# Patient Record
Sex: Female | Born: 1960
Health system: Southern US, Community
[De-identification: ages and names within clinical notes are randomized; demographics above are authoritative.]

## PROBLEM LIST (undated history)

## (undated) DIAGNOSIS — C50412 Malignant neoplasm of upper-outer quadrant of left female breast: Secondary | ICD-10-CM

## (undated) DIAGNOSIS — Z923 Personal history of irradiation: Secondary | ICD-10-CM

## (undated) DIAGNOSIS — I499 Cardiac arrhythmia, unspecified: Secondary | ICD-10-CM

## (undated) DIAGNOSIS — N6489 Other specified disorders of breast: Secondary | ICD-10-CM

## (undated) DIAGNOSIS — Z803 Family history of malignant neoplasm of breast: Secondary | ICD-10-CM

## (undated) DIAGNOSIS — I498 Other specified cardiac arrhythmias: Secondary | ICD-10-CM

## (undated) DIAGNOSIS — I472 Ventricular tachycardia, unspecified: Secondary | ICD-10-CM

## (undated) DIAGNOSIS — C50919 Malignant neoplasm of unspecified site of unspecified female breast: Secondary | ICD-10-CM

## (undated) HISTORY — DX: Family history of malignant neoplasm of breast: Z80.3

## (undated) HISTORY — DX: Ventricular tachycardia: I47.2

## (undated) HISTORY — DX: Personal history of irradiation: Z92.3

## (undated) HISTORY — DX: Cardiac arrhythmia, unspecified: I49.9

## (undated) HISTORY — DX: Other specified cardiac arrhythmias: I49.8

## (undated) HISTORY — DX: Malignant neoplasm of upper-outer quadrant of left female breast: C50.412

## (undated) HISTORY — DX: Ventricular tachycardia, unspecified: I47.20

## (undated) HISTORY — PX: COLONOSCOPY: SHX174

---

## 1998-05-15 ENCOUNTER — Other Ambulatory Visit: Admission: RE | Admit: 1998-05-15 | Discharge: 1998-05-15 | Payer: Self-pay | Admitting: Obstetrics and Gynecology

## 2001-01-20 ENCOUNTER — Other Ambulatory Visit: Admission: RE | Admit: 2001-01-20 | Discharge: 2001-01-20 | Payer: Self-pay | Admitting: Obstetrics and Gynecology

## 2002-05-09 ENCOUNTER — Other Ambulatory Visit: Admission: RE | Admit: 2002-05-09 | Discharge: 2002-05-09 | Payer: Self-pay | Admitting: Gynecology

## 2003-08-14 ENCOUNTER — Other Ambulatory Visit: Admission: RE | Admit: 2003-08-14 | Discharge: 2003-08-14 | Payer: Self-pay | Admitting: Gynecology

## 2005-02-10 ENCOUNTER — Ambulatory Visit: Payer: Self-pay | Admitting: Internal Medicine

## 2005-02-20 ENCOUNTER — Ambulatory Visit: Payer: Self-pay | Admitting: Internal Medicine

## 2005-03-07 ENCOUNTER — Ambulatory Visit: Payer: Self-pay | Admitting: Internal Medicine

## 2005-06-10 ENCOUNTER — Other Ambulatory Visit: Admission: RE | Admit: 2005-06-10 | Discharge: 2005-06-10 | Payer: Self-pay | Admitting: Obstetrics and Gynecology

## 2005-10-07 ENCOUNTER — Ambulatory Visit: Payer: Self-pay | Admitting: Internal Medicine

## 2006-04-28 ENCOUNTER — Ambulatory Visit: Payer: Self-pay | Admitting: Internal Medicine

## 2006-05-19 ENCOUNTER — Encounter: Admission: RE | Admit: 2006-05-19 | Discharge: 2006-08-17 | Payer: Self-pay | Admitting: Family Medicine

## 2007-05-14 DIAGNOSIS — I472 Ventricular tachycardia, unspecified: Secondary | ICD-10-CM | POA: Insufficient documentation

## 2007-05-14 DIAGNOSIS — I4729 Other ventricular tachycardia: Secondary | ICD-10-CM | POA: Insufficient documentation

## 2007-05-14 DIAGNOSIS — I059 Rheumatic mitral valve disease, unspecified: Secondary | ICD-10-CM | POA: Insufficient documentation

## 2007-05-14 DIAGNOSIS — E7800 Pure hypercholesterolemia, unspecified: Secondary | ICD-10-CM | POA: Insufficient documentation

## 2007-05-17 DIAGNOSIS — I10 Essential (primary) hypertension: Secondary | ICD-10-CM | POA: Insufficient documentation

## 2007-05-17 DIAGNOSIS — G4700 Insomnia, unspecified: Secondary | ICD-10-CM | POA: Insufficient documentation

## 2012-03-10 ENCOUNTER — Other Ambulatory Visit: Payer: Self-pay | Admitting: Family Medicine

## 2012-03-10 DIAGNOSIS — R7989 Other specified abnormal findings of blood chemistry: Secondary | ICD-10-CM

## 2012-03-11 ENCOUNTER — Ambulatory Visit
Admission: RE | Admit: 2012-03-11 | Discharge: 2012-03-11 | Disposition: A | Discharge status: Home / Self Care | Payer: Managed Care, Other (non HMO) | Source: Ambulatory Visit | Attending: Family Medicine | Admitting: Family Medicine

## 2012-03-11 DIAGNOSIS — R7989 Other specified abnormal findings of blood chemistry: Secondary | ICD-10-CM

## 2013-07-01 IMAGING — US US EXTREM LOW VENOUS*R*
1 series · 14 of 24 positions shown · non-contrast
Comparison: None.

CLINICAL DATA: Elevated D-dimer, leg pain, injury

RIGHT LOWER EXTREMITY VENOUS DUPLEX ULTRASOUND
TECHNIQUE: Gray-scale sonography with graded compression, as well
as color Doppler and duplex ultrasound were performed to evaluate
the deep venous system of the lower extremity from the level of the
common femoral vein through the popliteal and proximal calf veins.
Spectral Doppler was utilized to evaluate flow at rest and with
distal augmentation maneuvers.

[Series 1: us extrem low venous*right* · 14 of 29 slices shown]
[im 1/29]
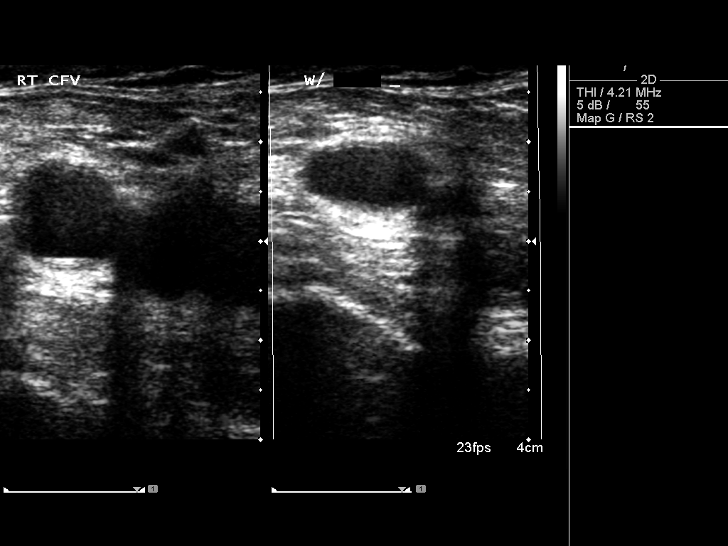
[im 3/29]
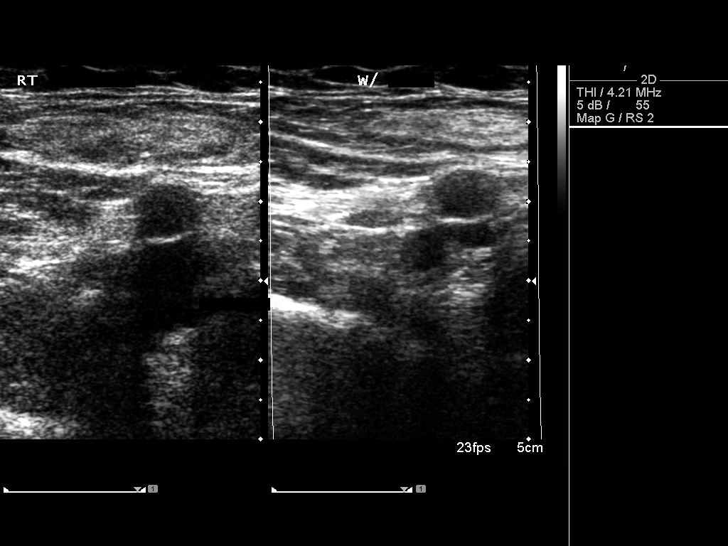
[im 5/29]
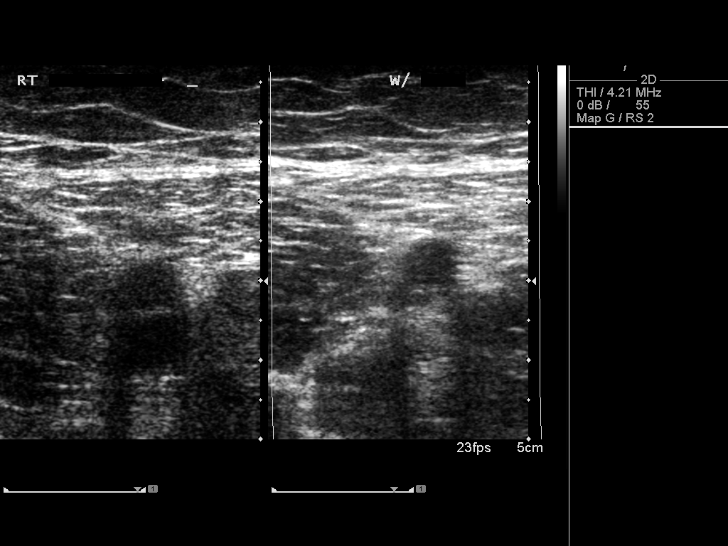
[im 8/29]
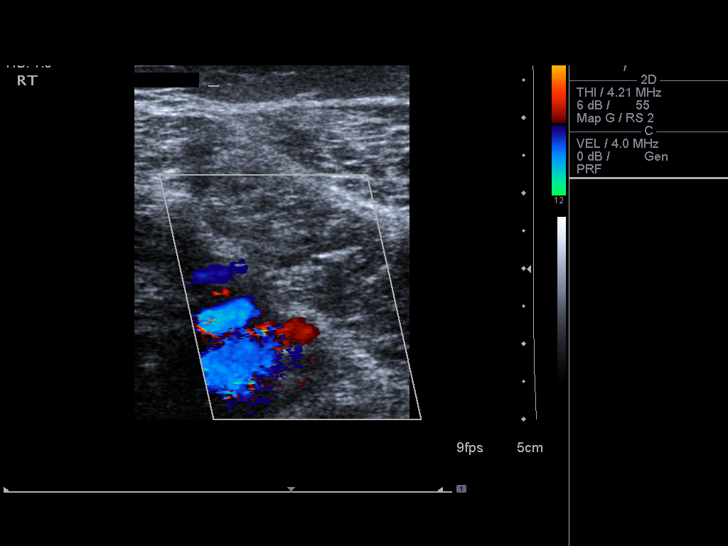
[im 9/29]
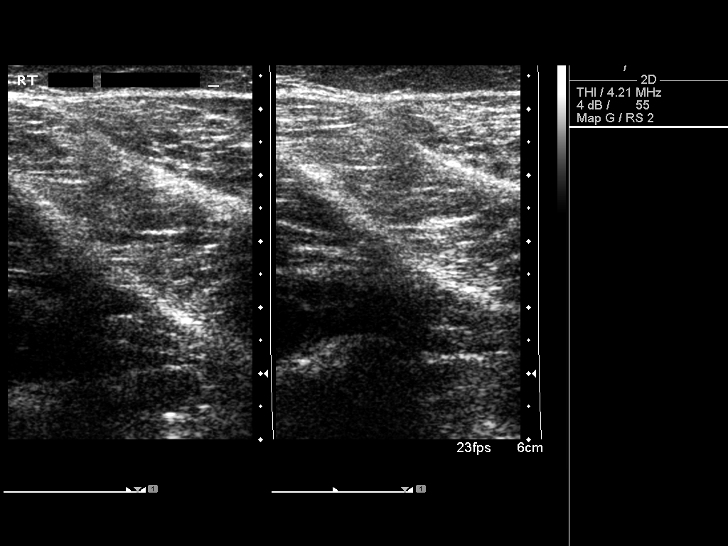
[im 11/29]
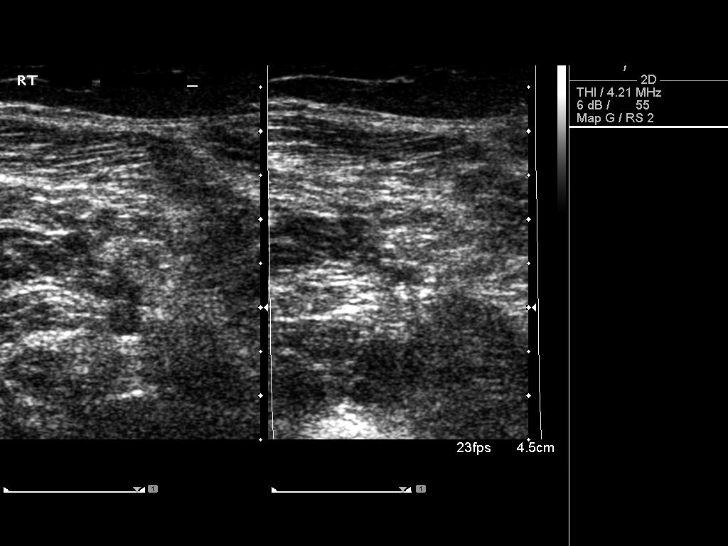
[im 14/29]
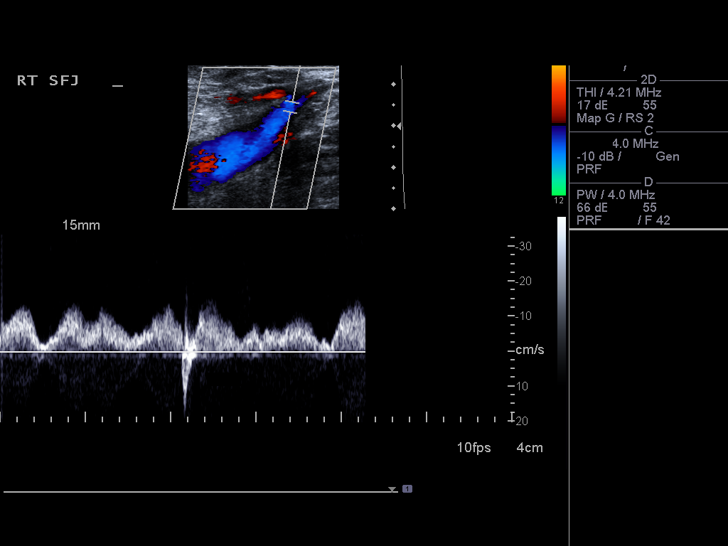
[im 15/29]
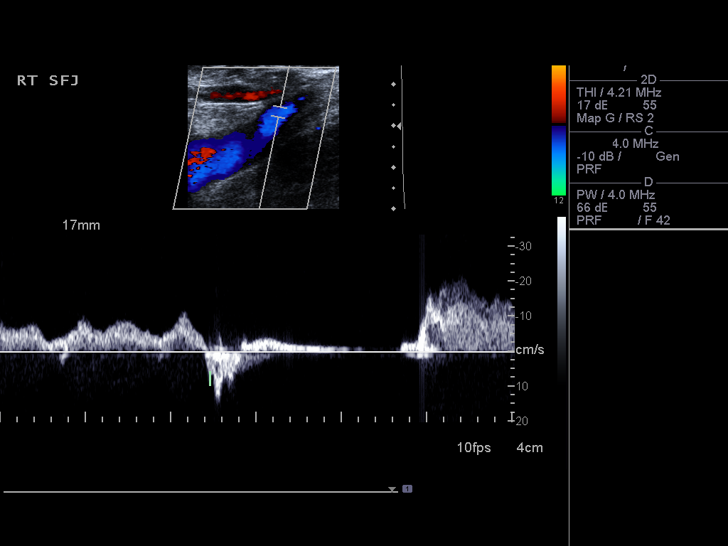
[im 18/29]
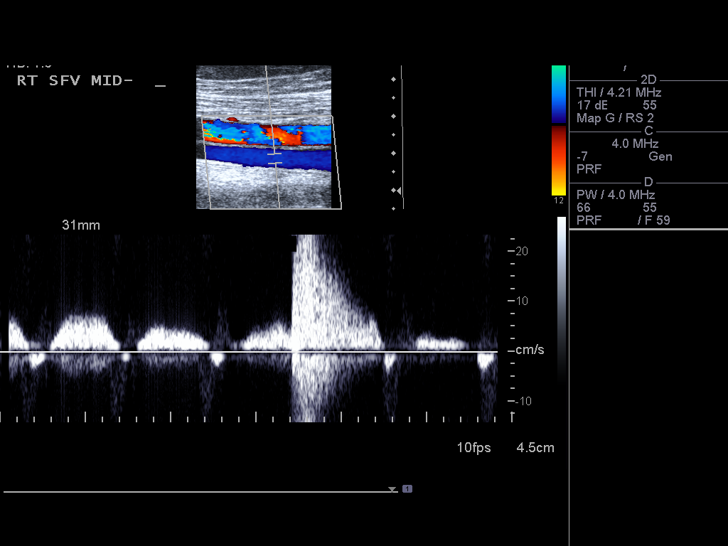
[im 20/29]
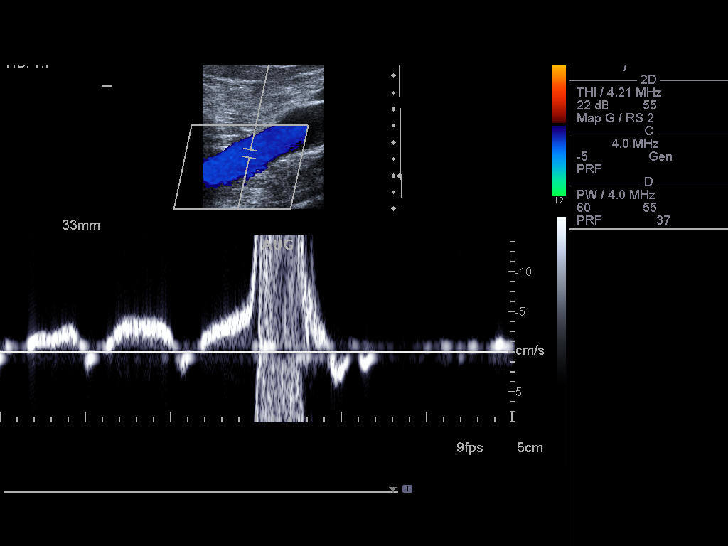
[im 22/29]
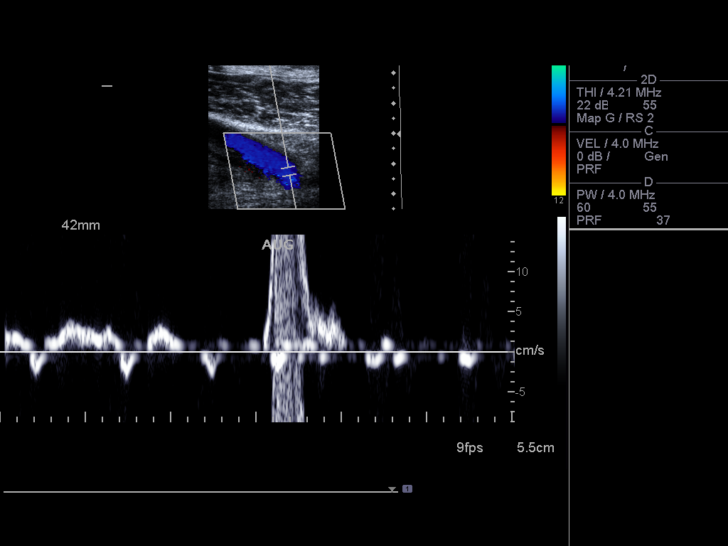
[im 24/29]
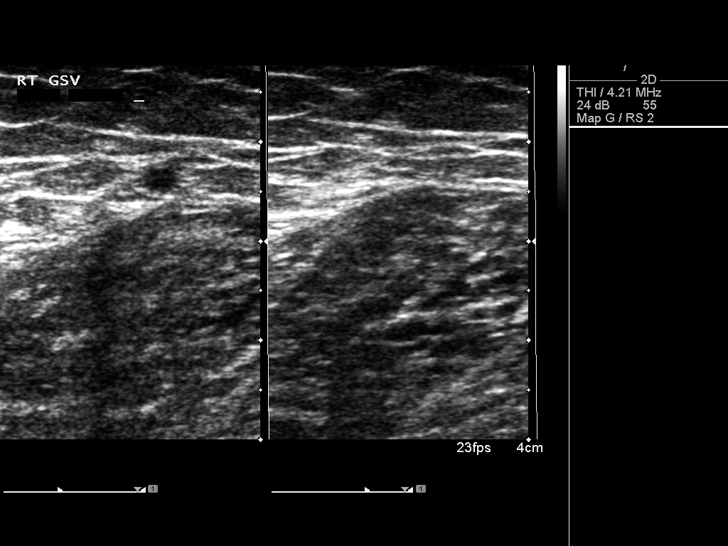
[im 26/29]
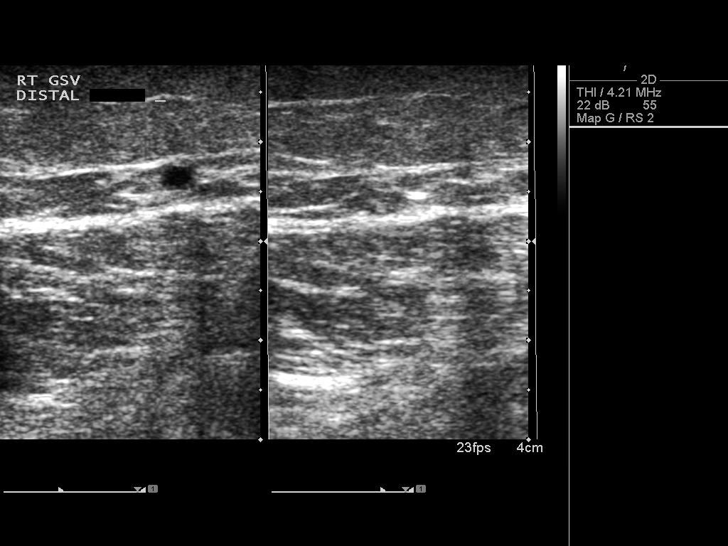
[im 29/29]
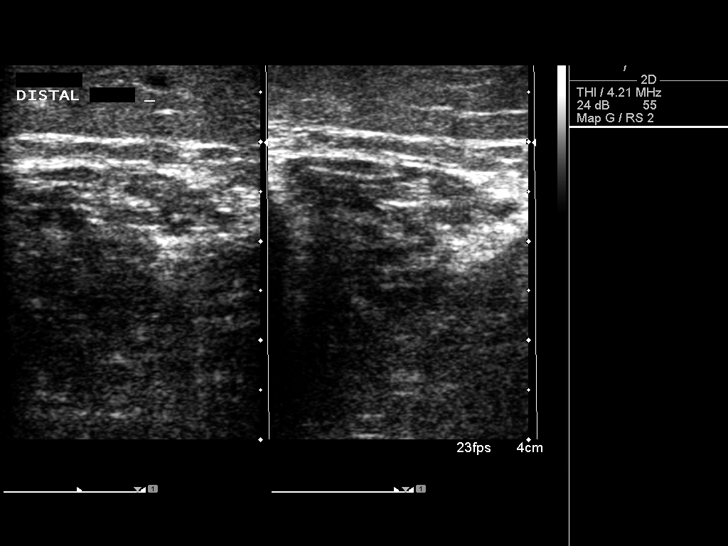

[14 of 24 positions shown; findings below may reference images not displayed]

FINDINGS: Normal compressibility of the common femoral,
superficial femoral, and popliteal veins is demonstrated, as well
as the visualized proximal calf veins.  No filling defects to
suggest DVT on grayscale or color Doppler imaging.  Doppler
waveforms show normal direction of venous flow, normal respiratory
phasicity and response to augmentation.
IMPRESSION: No evidence of lower extremity deep vein thrombosis.

## 2013-12-08 ENCOUNTER — Other Ambulatory Visit: Payer: Self-pay | Admitting: Obstetrics and Gynecology

## 2013-12-08 DIAGNOSIS — R928 Other abnormal and inconclusive findings on diagnostic imaging of breast: Secondary | ICD-10-CM

## 2013-12-19 ENCOUNTER — Ambulatory Visit
Admission: RE | Admit: 2013-12-19 | Discharge: 2013-12-19 | Disposition: A | Discharge status: Home / Self Care | Payer: Managed Care, Other (non HMO) | Source: Ambulatory Visit | Attending: Obstetrics and Gynecology | Admitting: Obstetrics and Gynecology

## 2013-12-19 ENCOUNTER — Other Ambulatory Visit: Payer: Self-pay | Admitting: Obstetrics and Gynecology

## 2013-12-19 DIAGNOSIS — R928 Other abnormal and inconclusive findings on diagnostic imaging of breast: Secondary | ICD-10-CM

## 2015-05-10 ENCOUNTER — Encounter: Payer: Self-pay | Admitting: Family Medicine

## 2015-05-10 ENCOUNTER — Ambulatory Visit (INDEPENDENT_AMBULATORY_CARE_PROVIDER_SITE_OTHER): Payer: BLUE CROSS/BLUE SHIELD | Admitting: Family Medicine

## 2015-05-10 ENCOUNTER — Encounter (INDEPENDENT_AMBULATORY_CARE_PROVIDER_SITE_OTHER): Payer: Self-pay

## 2015-05-10 VITALS — BP 166/92 | HR 65 | Ht 69.0 in | Wt 180.0 lb

## 2015-05-10 DIAGNOSIS — M25561 Pain in right knee: Secondary | ICD-10-CM | POA: Diagnosis not present

## 2015-05-10 MED ORDER — METHYLPREDNISOLONE ACETATE 40 MG/ML IJ SUSP
40.0000 mg | Freq: Once | INTRAMUSCULAR | Status: AC
  Administered 2015-05-10: 40 mg via INTRA_ARTICULAR

## 2015-05-10 NOTE — Assessment & Plan Note (Signed)
suspect either initial acute gout flare, synovitis, or flare of DJD.  Discussed options and patient opted for injection without aspiration today.  Icing, elevation, compression.  NSAIDs as needed.  F/u prn.  After informed written consent, patient was seated on exam table. Right knee was prepped with alcohol swab and utilizing superolateral approach with ultrasound guidance, patient's right knee was injected intraarticularly with 3:1 marcaine: depomedrol. Patient tolerated the procedure well without immediate complications.

## 2015-05-10 NOTE — Patient Instructions (Signed)
Your knee pain and swelling are due to either acute gout, synovitis, or an acute flare of arthritis. All are treated similarly. You were given a cortisone shot today. Icing 15 minutes at a time up to every hour. Elevate above your heart when possible. Compression sleeve or ACE wrap to help with swelling. Ibuprofen 600mg  three times a day OR aleve 2 tabs twice a day with food for pain and inflammation as needed. Activities as tolerated. Follow up with me as needed.

## 2015-05-10 NOTE — Progress Notes (Signed)
PCP: No primary care provider on file.  Subjective:   HPI: Patient is a 54 y.o. female here for right knee pain.  Patient denies known injury. She states for 6 days she's had worsening right knee pain and swelling. Is training currently [place] in just over a week. Feel about 8 weeks ago but no residual pain leading up to this. Pain is dull. Worse with walking. Diffusely around the right knee. No pain or swelling other joints. No history of rheumatoid arthritis, gout. No fever. Joint has been warm, slightly red. No catching, locking, giving out.  No past medical history on file.  No current outpatient prescriptions on file prior to visit.   No current facility-administered medications on file prior to visit.    No past surgical history on file.  No Known Allergies  Social History   Social History  . Marital Status: Divorced    Spouse Name: N/A  . Number of Children: N/A  . Years of Education: N/A   Occupational History  . Not on file.   Social History Main Topics  . Smoking status: Never Smoker   . Smokeless tobacco: Not on file  . Alcohol Use: Not on file  . Drug Use: Not on file  . Sexual Activity: Not on file   Other Topics Concern  . Not on file   Social History Narrative  . No narrative on file    No family history on file.  Ht 5\' 9"  (1.753 m)  Wt 180 lb (81.647 kg)  BMI 26.57 kg/m2  Review of Systems: See HPI above.    Objective:  Physical Exam:  Gen: NAD  Right knee: Mod effusion.  No bruising, other deformity. Mild diffuse tenderness. ROM 0 - 120 degrees. Negative ant/post drawers. Negative valgus/varus testing. Negative lachmanns. Negative mcmurrays, apleys, patellar apprehension. NV intact distally.  Left knee: FROM without pain, effusion.    Assessment & Plan:  1. Right knee pain, effusion - suspect either initial acute gout flare, synovitis, or flare of DJD.  Discussed options and patient opted for injection  without aspiration today.  Icing, elevation, compression.  NSAIDs as needed.  F/u prn.  After informed written consent, patient was seated on exam table. Right knee was prepped with alcohol swab and utilizing superolateral approach with ultrasound guidance, patient's right knee was injected intraarticularly with 3:1 marcaine: depomedrol. Patient tolerated the procedure well without immediate complications.

## 2015-05-10 NOTE — Addendum Note (Signed)
Addended by: Sherrie George F on: 05/10/2015 04:53 PM   Modules accepted: Orders

## 2016-02-19 ENCOUNTER — Other Ambulatory Visit: Payer: Self-pay | Admitting: Obstetrics and Gynecology

## 2016-02-19 DIAGNOSIS — R928 Other abnormal and inconclusive findings on diagnostic imaging of breast: Secondary | ICD-10-CM

## 2016-06-26 ENCOUNTER — Ambulatory Visit
Admission: RE | Admit: 2016-06-26 | Discharge: 2016-06-26 | Disposition: A | Discharge status: Home / Self Care | Payer: BLUE CROSS/BLUE SHIELD | Source: Ambulatory Visit | Attending: Obstetrics and Gynecology | Admitting: Obstetrics and Gynecology

## 2016-06-26 ENCOUNTER — Other Ambulatory Visit: Payer: Self-pay | Admitting: Obstetrics and Gynecology

## 2016-06-26 DIAGNOSIS — N6320 Unspecified lump in the left breast, unspecified quadrant: Secondary | ICD-10-CM

## 2016-06-26 DIAGNOSIS — R928 Other abnormal and inconclusive findings on diagnostic imaging of breast: Secondary | ICD-10-CM

## 2016-06-30 ENCOUNTER — Other Ambulatory Visit: Payer: Self-pay | Admitting: Obstetrics and Gynecology

## 2016-06-30 DIAGNOSIS — N6320 Unspecified lump in the left breast, unspecified quadrant: Secondary | ICD-10-CM

## 2016-07-01 ENCOUNTER — Ambulatory Visit
Admission: RE | Admit: 2016-07-01 | Discharge: 2016-07-01 | Disposition: A | Discharge status: Home / Self Care | Payer: BLUE CROSS/BLUE SHIELD | Source: Ambulatory Visit | Attending: Obstetrics and Gynecology | Admitting: Obstetrics and Gynecology

## 2016-07-01 DIAGNOSIS — N6320 Unspecified lump in the left breast, unspecified quadrant: Secondary | ICD-10-CM

## 2016-07-02 ENCOUNTER — Encounter: Payer: Self-pay | Admitting: *Deleted

## 2016-07-02 ENCOUNTER — Telehealth: Payer: Self-pay | Admitting: *Deleted

## 2016-07-02 DIAGNOSIS — C50412 Malignant neoplasm of upper-outer quadrant of left female breast: Secondary | ICD-10-CM

## 2016-07-02 HISTORY — DX: Malignant neoplasm of upper-outer quadrant of left female breast: C50.412

## 2016-07-02 NOTE — Telephone Encounter (Signed)
Confirmed [facility] for 07/09/16 at 1215 .  Instructions and contact information given.

## 2016-07-06 ENCOUNTER — Encounter: Payer: Self-pay | Admitting: General Surgery

## 2016-07-09 ENCOUNTER — Encounter: Payer: Self-pay | Admitting: Hematology and Oncology

## 2016-07-09 ENCOUNTER — Other Ambulatory Visit (HOSPITAL_BASED_OUTPATIENT_CLINIC_OR_DEPARTMENT_OTHER): Payer: BLUE CROSS/BLUE SHIELD

## 2016-07-09 ENCOUNTER — Ambulatory Visit: Payer: BLUE CROSS/BLUE SHIELD | Attending: General Surgery | Admitting: Physical Therapy

## 2016-07-09 ENCOUNTER — Ambulatory Visit
Admission: RE | Admit: 2016-07-09 | Discharge: 2016-07-09 | Disposition: A | Discharge status: Home / Self Care | Payer: BLUE CROSS/BLUE SHIELD | Source: Ambulatory Visit | Attending: Radiation Oncology | Admitting: Radiation Oncology

## 2016-07-09 ENCOUNTER — Other Ambulatory Visit: Payer: Self-pay | Admitting: General Surgery

## 2016-07-09 ENCOUNTER — Encounter: Payer: Self-pay | Admitting: Physical Therapy

## 2016-07-09 ENCOUNTER — Ambulatory Visit (HOSPITAL_BASED_OUTPATIENT_CLINIC_OR_DEPARTMENT_OTHER): Payer: BLUE CROSS/BLUE SHIELD | Admitting: Hematology and Oncology

## 2016-07-09 DIAGNOSIS — Z170 Estrogen receptor positive status [ER+]: Secondary | ICD-10-CM

## 2016-07-09 DIAGNOSIS — C50412 Malignant neoplasm of upper-outer quadrant of left female breast: Secondary | ICD-10-CM

## 2016-07-09 DIAGNOSIS — R293 Abnormal posture: Secondary | ICD-10-CM | POA: Diagnosis present

## 2016-07-09 LAB — CBC WITH DIFFERENTIAL/PLATELET
BASO%: 0.6 % (ref 0.0–2.0)
BASOS ABS: 0 10*3/uL (ref 0.0–0.1)
EOS ABS: 0.1 10*3/uL (ref 0.0–0.5)
EOS%: 0.8 % (ref 0.0–7.0)
HEMATOCRIT: 41.2 % (ref 34.8–46.6)
HEMOGLOBIN: 13.8 g/dL (ref 11.6–15.9)
LYMPH#: 1.9 10*3/uL (ref 0.9–3.3)
LYMPH%: 28.9 % (ref 14.0–49.7)
MCH: 31.7 pg (ref 25.1–34.0)
MCHC: 33.4 g/dL (ref 31.5–36.0)
MCV: 94.8 fL (ref 79.5–101.0)
MONO#: 0.3 10*3/uL (ref 0.1–0.9)
MONO%: 4.7 % (ref 0.0–14.0)
NEUT#: 4.2 10*3/uL (ref 1.5–6.5)
NEUT%: 65 % (ref 38.4–76.8)
PLATELETS: 221 10*3/uL (ref 145–400)
RBC: 4.35 10*6/uL (ref 3.70–5.45)
RDW: 12.4 % (ref 11.2–14.5)
WBC: 6.4 10*3/uL (ref 3.9–10.3)

## 2016-07-09 LAB — COMPREHENSIVE METABOLIC PANEL
ALBUMIN: 3.8 g/dL (ref 3.5–5.0)
ALK PHOS: 46 U/L (ref 40–150)
ALT: 19 U/L (ref 0–55)
ANION GAP: 8 meq/L (ref 3–11)
AST: 17 U/L (ref 5–34)
BUN: 15.7 mg/dL (ref 7.0–26.0)
CALCIUM: 9.6 mg/dL (ref 8.4–10.4)
CO2: 26 mEq/L (ref 22–29)
Chloride: 105 mEq/L (ref 98–109)
Creatinine: 0.8 mg/dL (ref 0.6–1.1)
EGFR: 84 mL/min/{1.73_m2} — ABNORMAL LOW (ref >90)
Glucose: 112 mg/dl (ref 70–140)
POTASSIUM: 4.1 meq/L (ref 3.5–5.1)
Sodium: 138 mEq/L (ref 136–145)
Total Bilirubin: 0.42 mg/dL (ref 0.20–1.20)
Total Protein: 7.1 g/dL (ref 6.4–8.3)

## 2016-07-09 NOTE — Progress Notes (Signed)
ON[facility]N DISTRESS SCREENING 07/09/2016  Screening Type Initial Screening  Distress experienced in past week (1-10) 3  Emotional problem type Adjusting to illness  Information Concerns Type Lack of info about treatment  Referral to support programs Yes   Met with patient in [facility] to introduce [facility] team/resources, reviewing distress screen per protocol.  The patient scored a 3 on the Psychosocial Distress Thermometer which indicates mild distress. Also assessed for distress and other psychosocial needs.  Patient came to clinic with her husband and seemed very positive. Patient admitted that the day she received her diagnosis was a "bad day" but that she's been fine since and feels much better having received more information about her diagnosis and treatment. Patient said that she likes to stay organized and feels comfort in having a plan. Patient shared that she rarely reaches out to others for emotional support but she was glad to know about the support center and the services offered there.  Rosana Fret, Counseling Intern Supervisor - Lorrin Jackson, Chaplain

## 2016-07-09 NOTE — Progress Notes (Signed)
Radiation Oncology         [phone] ________________________________  Initial Outpatient Consultation  Name: Doris Bray MRN: [id number]  Date: 07/09/2016  DOB: [birth year]  WU:[id number],YNWGN Marigene Ehlers, MD  [name], MD   REFERRING PHYSICIAN: [name], MD   CHIEF COMPLAINTS/PURPOSE OF CONSULTATION:  Newly diagnosed breast cancer  DIAGNOSIS: The encounter diagnosis was Malignant neoplasm of upper-outer quadrant of left breast in female, estrogen receptor positive ([place]).   Clinical stage T1bNxMx grade 1 IDC and DCIS of the left breast (ER/PR+, HER2-)  HISTORY OF PRESENT ILLNESS::Doris Bray is a 55 y.o. female who had a screening mammogram that noted a lateral left breast mass.  Diagnostic left breast mammogram on 06/26/16 revealed a 1.1 cm mass containing a calcification. On physical exam, no mass was palpated. US showed a mass measuring 0.8 x 0.5 x 1.0 cm in the 2:00 position, 4 cm from the nipple containing a calcification. No left axillary adenopathy was noted.  Biopsy on 07/01/16 revealed grade 1 invasive ductal carcinoma and DCIS (ER 100% positive, PR 100% positive, HER2 negative, Ki67 10%).  The patient presents today in multidisciplinary breast clinic to discuss treatment options for the management of her disease.  Gynecologic History  Age at first menstrual period? 10  Are you still having periods? No  If you no longer have periods: Have you used hormone replacement? Yes  If YES, for how long? 3 years Obstetric History:  How many children have you carried to term? 2 Your age at first live birth? 51  Pregnant now or trying to get pregnant? No  Have you used birth control pills or hormone shots for contraception? Yes  If so, for how long (or approximate dates)? 5621-3086 Health Maintenance:  Have you ever had a colonoscopy? Yes If yes, date? 2017  Have you ever had a bone density? Yes If yes, date? 2000  Date of your last PAP smear?  2017  PREVIOUS RADIATION THERAPY: No  PAST MEDICAL HISTORY:  has a past medical history of Bigeminy; Malignant neoplasm of upper-outer quadrant of left female breast ([facility]) (07/02/2016); and Ventricular tachycardia ([place]).    PAST SURGICAL HISTORY: Past Surgical History:  Procedure Laterality Date  . CESAREAN SECTION     x2    FAMILY HISTORY: family history includes Breast cancer (age of onset: 69) in her mother.  SOCIAL HISTORY:  reports that she has never smoked. She has never used smokeless tobacco. She reports that she drinks alcohol. She reports that she does not use drugs.  ALLERGIES: Patient has no known allergies.  MEDICATIONS:  Current Outpatient Prescriptions  Medication Sig Dispense Refill  . estradiol (ESTRACE) 0.5 MG tablet Take 0.5 mg by mouth daily.     No current facility-administered medications for this encounter.     REVIEW OF SYSTEMS:  A 15 point review of systems is documented in the electronic medical record. This was obtained by the nursing staff. However, I reviewed this with the patient to discuss relevant findings and make appropriate changes.  Pertinent items noted in HPI and remainder of comprehensive ROS otherwise negative.   The patient reports of runner stiffness and occasional hot flashes.   PHYSICAL EXAM:  vitals were not taken for this visit.  Vitals with BMI 07/09/2016  Height '5\' 9"'$   Weight 184 lbs 5 oz  BMI 57.8  Systolic 469  Diastolic 69  Pulse 87  Respirations 18  General: Alert and oriented, in no acute distress HEENT: Head is normocephalic. Extraocular  movements are intact. Oropharynx is clear. Neck: Neck is supple, no palpable cervical or supraclavicular lymphadenopathy. Heart: Regular in rate and rhythm with no murmurs, rubs, or gallops. Chest: Clear to auscultation bilaterally, with no rhonchi, wheezes, or rales. Abdomen: Soft, nontender, nondistended, with no rigidity or guarding. Extremities: No cyanosis or edema. Lymphatics:  see Neck Exam Skin: No concerning lesions. Musculoskeletal: symmetric strength and muscle tone throughout. Neurologic: Cranial nerves II through XII are grossly intact. No obvious focalities. Speech is fluent. Coordination is intact. Psychiatric: Judgment and insight are intact. Affect is appropriate. Breast Exam: Right breast no palpable mass or nipple discharge. Left breast some bruising in the lateral aspect of the breast. Question a 1 cm nodule in the 2:00 position of the left breast, could be bruising, no nipple discharge or bleeding.   ECOG = 0  LABORATORY DATA:  Lab Results  Component Value Date   WBC 6.4 07/09/2016   HGB 13.8 07/09/2016   HCT 41.2 07/09/2016   MCV 94.8 07/09/2016   PLT 221 07/09/2016   NEUTROABS 4.2 07/09/2016   Lab Results  Component Value Date   NA 138 07/09/2016   K 4.1 07/09/2016   CO2 26 07/09/2016   GLUCOSE 112 07/09/2016   CREATININE 0.8 07/09/2016   CALCIUM 9.6 07/09/2016      RADIOGRAPHY: US Breast Ltd Uni Left Inc Axilla  Result Date: 06/26/2016 CLINICAL DATA:  The patient was called back screening mammography due to a mass in the lateral left breast. EXAM: 2D DIGITAL DIAGNOSTIC LEFT MAMMOGRAM WITH CAD AND ADJUNCT TOMO ULTRASOUND LEFT BREAST COMPARISON:  Previous exam(s). ACR Breast Density Category b: There are scattered areas of fibroglandular density. FINDINGS: There is a spiculated mass in the lateral left breast measuring 11 mm mammographically, containing a calcification. An asymmetry in the medial left breast resolves with additional imaging. Mammographic images were processed with CAD. On physical exam, no suspicious lumps are identified. Targeted ultrasound is performed, showing there is a hypoechoic mildly irregular mass at 2 o'clock, 4 cm from the nipple containing a calcification measuring 8 x 5 x 10 mm. No axillary adenopathy. No sonographic abnormalities in the medial left breast. IMPRESSION: Suspicious mass in the upper outer left  breast. RECOMMENDATION: Ultrasound-guided biopsy of the left breast mass at 2 o'clock. Recommend clip placement with follow-up mammography to confirm the sonographic and mammographic findings correlate. I have discussed the findings and recommendations with the patient. Results were also provided in writing at the conclusion of the visit. If applicable, a reminder letter will be sent to the patient regarding the next appointment. BI-RADS CATEGORY  4: Suspicious. Electronically Signed   By: Gerome Sam III M.D   On: 06/26/2016 17:17   Mm Diag Breast Tomo Uni Left  Result Date: 06/26/2016 CLINICAL DATA:  The patient was called back screening mammography due to a mass in the lateral left breast. EXAM: 2D DIGITAL DIAGNOSTIC LEFT MAMMOGRAM WITH CAD AND ADJUNCT TOMO ULTRASOUND LEFT BREAST COMPARISON:  Previous exam(s). ACR Breast Density Category b: There are scattered areas of fibroglandular density. FINDINGS: There is a spiculated mass in the lateral left breast measuring 11 mm mammographically, containing a calcification. An asymmetry in the medial left breast resolves with additional imaging. Mammographic images were processed with CAD. On physical exam, no suspicious lumps are identified. Targeted ultrasound is performed, showing there is a hypoechoic mildly irregular mass at 2 o'clock, 4 cm from the nipple containing a calcification measuring 8 x 5 x 10 mm. No axillary adenopathy.  No sonographic abnormalities in the medial left breast. IMPRESSION: Suspicious mass in the upper outer left breast. RECOMMENDATION: Ultrasound-guided biopsy of the left breast mass at 2 o'clock. Recommend clip placement with follow-up mammography to confirm the sonographic and mammographic findings correlate. I have discussed the findings and recommendations with the patient. Results were also provided in writing at the conclusion of the visit. If applicable, a reminder letter will be sent to the patient regarding the next  appointment. BI-RADS CATEGORY  4: Suspicious. Electronically Signed   By: Dorise Bullion III M.D   On: 06/26/2016 17:17   Mm Clip Placement Left  Result Date: 07/01/2016 CLINICAL DATA:  Status post ultrasound-guided biopsy of a left breast mass today. EXAM: DIAGNOSTIC LEFT MAMMOGRAM POST ULTRASOUND BIOPSY COMPARISON:  Previous exam(s). FINDINGS: Mammographic images were obtained following ultrasound guided biopsy of a left breast mass at the 2 o'clock axis. At the conclusion of the procedure, a coil shaped tissue marker was placed at the biopsy site. Biopsy clip is well positioned within the mass. IMPRESSION: Postprocedure mammogram for clip placement. Coil shaped biopsy clip well-positioned within the left breast mass. Final Assessment: Post Procedure Mammograms for Marker Placement Electronically Signed   By: Franki Cabot M.D.   On: 07/01/2016 08:29   [place] Lt Breast Bx W Loc Dev 1st Lesion Img Bx Spec US Guide  Addendum Date: 07/08/2016   ADDENDUM REPORT: 07/07/2016 07:42 ADDENDUM: Pathology revealed grade I invasive ductal carcinoma and ductal carcinoma in situ in the left breast. This was found to be concordant by Dr. Franki Cabot. Pathology results were discussed with the patient by telephone. The patient reported doing well after the biopsy. Post biopsy instructions and care were reviewed and questions were answered. The patient was encouraged to call [facility] for any additional concerns. The patient was referred to the [facility] at the [facility] on July 09, 2016. Pathology results reported by Susa Raring RN, BSN on 07/07/2016. Electronically Signed   By: Franki Cabot M.D.   On: 07/07/2016 07:42   Result Date: 07/08/2016 CLINICAL DATA:  Patient with a left breast mass presents today for ultrasound-guided core biopsy. EXAM: ULTRASOUND GUIDED LEFT BREAST CORE NEEDLE BIOPSY COMPARISON:  Previous exam(s).  PROCEDURE: I met with the patient and we discussed the procedure of ultrasound-guided biopsy, including benefits and alternatives. We discussed the high likelihood of a successful procedure. We discussed the risks of the procedure including infection, bleeding, tissue injury, clip migration, and inadequate sampling. Informed written consent was given. The usual time-out protocol was performed immediately prior to the procedure. Using sterile technique and 1% Lidocaine as local anesthetic, under direct ultrasound visualization, a 12 gauge spring-loaded device was used to perform biopsy of the left breast mass at the 2 o'clock axisusing a lateral approach. At the conclusion of the procedure, a coil shaped tissue marker clip was deployed into the biopsy cavity. Follow-up 2-view mammogram was performed and dictated separately. IMPRESSION: Ultrasound-guided biopsy of the left breast mass at the 2 o'clock axis. No apparent complications. Electronically Signed: By: Franki Cabot M.D. 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 She does have a 1st degree relative (mother with breast cancer age 29). Therefore she would qualify for genetic testing, but this would  likely not delay her plan of a left lumpectomy and sentinel lymph node biopsy.  I spoke to the patient today regarding her diagnosis and options for treatment. We discussed the equivalence in terms of survival and local failure between mastectomy and breast conservation. We discussed the role of radiation in decreasing local failures in patients who undergo lumpectomy. We discussed the process of simulation and the placement tattoos. We discussed 4-6 weeks of treatment as an outpatient. We discussed the possibility of asymptomatic lung damage. We discussed the low likelihood  of secondary malignancies. We discussed the possible side effects including but not limited to skin redness, fatigue, permanent skin darkening, and breast swelling.  We discussed the use of cardiac sparing with deep inspiration breath hold if needed.  PLAN: The patient will be scheduled for a left lumpectomy and sentinel lymph node biopsy and Oncotype testing conducted on the pathology specimen. I did clarify with her that if Oncotype indicated she needed chemotherapy, then this would be performed prior to radiation. The patient would then return to me to discuss radiation therapy and begin radiation treatment soon thereafter. The patient would then be placed on an AI after she completes radiation. The patient is also candidate for genetic testing and she is interested in this and likely meet with the geneticist.     ------------------------------------------------  Blair Promise, PhD, MD  This document serves as a record of services personally performed by [name], MD. It was created on his behalf by Darcus Austin, a trained medical scribe. The creation of this record is based on the scribe's personal observations and the provider's statements to them. This document has been checked and approved by the attending provider.

## 2016-07-09 NOTE — Progress Notes (Signed)
Nutrition Assessment  Reason for Assessment:  Pt seen in Breast Clinic  ASSESSMENT:   55 year old female with new diagnosis of left breast mass.   Past medical history reviewed.  Medications:  reviewed  Labs: reviewed  Anthropometrics:   Height: 69 inches Weight: 184 lb BMI: 27.3   NUTRITION DIAGNOSIS: Food and nutrition related knowledge deficit related to new diagnosis of breast cancer as evidenced by no prior need for nutrition related information.  INTERVENTION:   Discussed and provided packet of information regarding nutritional tips for breast cancer patients.  Questions answered.  Teachback method used.      MONITORING, EVALUATION, and GOAL: Pt will consume a healthy plant based diet to maintain lean body mass throughout treatment.   Jerriah Ines B. Zenia Resides, [place], [place] (pager)

## 2016-07-09 NOTE — Therapy (Signed)
[place], [place], [address] Phone: [phone]   Fax:  [phone]  Physical Therapy Evaluation  Patient Details  Name: Doris Bray MRN: [id number] Date of Birth: Apr 26, 1961 Referring Provider: Dr. [name]  Encounter Date: 07/09/2016      PT End of Session - 07/09/16 1657    Visit Number 1   Number of Visits 1   PT Start Time 1450   PT Stop Time 1515   PT Time Calculation (min) 25 min   Activity Tolerance Patient tolerated treatment well   Behavior During Therapy [facility] for tasks assessed/performed      Past Medical History:  Diagnosis Date  . Bigeminy   . Malignant neoplasm of upper-outer quadrant of left female breast ([place]) 07/02/2016  . Ventricular tachycardia [facility]     Past Surgical History:  Procedure Laterality Date  . CESAREAN SECTION     x2    There were no vitals filed for this visit.       Subjective Assessment - 07/09/16 1658    Subjective Patient reports she is here today to be seen by her medical team for her newly diagnosed left breast cancer.   Patient is accompained by: Family member   Pertinent History Patient was diagnosed on 07/01/16 with left invasive breast cancer. It measures 1 cm and is located in the upper outer quadrant, is ER/PR positive and HER2 negative.   Patient Stated Goals Reduce lymphedema risk and learn post op shoulder ROM HEP            [facility] PT Assessment - 07/09/16 0001      Assessment   Medical Diagnosis Left breast cancer   Referring Provider Dr. [name]   Onset Date/Surgical Date 07/01/16   Hand Dominance Right   Prior Therapy none     Precautions   Precautions Other (comment)   Precaution Comments active cancer     Restrictions   Weight Bearing Restrictions No     Balance Screen   Has the patient fallen in the past 6 months No   Has the patient had a decrease in activity level because of a fear of falling?   No   Is the patient reluctant to leave their home because of a fear of falling?  No     Home Ecologist residence   Living Arrangements Spouse/significant other   Available Help at Discharge Family     Prior Function   Level of Independence Independent   Vocation Full time employment   Careers adviser   Leisure Runs 3-5x/week for 30-45 minutes     Cognition   Overall Cognitive Status Within Functional Limits for tasks assessed     Posture/Postural Control   Posture/Postural Control Postural limitations   Postural Limitations Rounded Shoulders     ROM / Strength   AROM / PROM / Strength AROM;Strength     AROM   AROM Assessment Site Shoulder;Cervical   Right/Left Shoulder Right;Left   Right Shoulder Extension 40 Degrees   Right Shoulder Flexion 150 Degrees   Right Shoulder ABduction 166 Degrees   Right Shoulder Internal Rotation 65 Degrees   Right Shoulder External Rotation 80 Degrees   Left Shoulder Extension 55 Degrees   Left Shoulder Flexion 158 Degrees   Left Shoulder ABduction 169 Degrees   Left Shoulder Internal Rotation 80 Degrees   Left Shoulder External Rotation 76 Degrees   Cervical Flexion WNL  Cervical Extension WNL   Cervical - Right Side Bend WNL   Cervical - Left Side Bend WNL   Cervical - Right Rotation WNL   Cervical - Left Rotation WNL     Strength   Overall Strength Within functional limits for tasks performed           LYMPHEDEMA/ONCOLOGY QUESTIONNAIRE - 07/09/16 1656      Type   Cancer Type Left breast cancer     Lymphedema Assessments   Lymphedema Assessments Upper extremities     Right Upper Extremity Lymphedema   10 cm Proximal to Olecranon Process 29.4 cm   Olecranon Process 26 cm   10 cm Proximal to Ulnar Styloid Process 23.5 cm   Just Proximal to Ulnar Styloid Process 16.2 cm   Across Hand at [employer] 19.8 cm   At [place] of 2nd Digit 6.5 cm     Left Upper Extremity  Lymphedema   10 cm Proximal to Olecranon Process 29 cm   Olecranon Process 25.5 cm   10 cm Proximal to Ulnar Styloid Process 22.3 cm   Just Proximal to Ulnar Styloid Process 15.5 cm   Across Hand at [employer] 19.6 cm   At [place] of 2nd Digit 6.1 cm      Patient was instructed today in a home exercise program today for post op shoulder range of motion. These included active assist shoulder flexion in sitting, scapular retraction, wall walking with shoulder abduction, and hands behind head external rotation.  She was encouraged to do these twice a day, holding 3 seconds and repeating 5 times when permitted by her physician.         PT Education - 07/09/16 1657    Education provided Yes   Education Details Lymphedema risk reduction and post op shoulder ROM HEP   Person(s) Educated Patient;Spouse   Methods Explanation;Demonstration;Handout   Comprehension Returned demonstration;Verbalized understanding              Breast Clinic Goals - 07/09/16 1700      Patient will be able to verbalize understanding of pertinent lymphedema risk reduction practices relevant to her diagnosis specifically related to skin care.   Time 1   Period Days   Status Achieved     Patient will be able to return demonstrate and/or verbalize understanding of the post-op home exercise program related to regaining shoulder range of motion.   Time 1   Period Days   Status Achieved     Patient will be able to verbalize understanding of the importance of attending the postoperative After Breast Cancer Class for further lymphedema risk reduction education and therapeutic exercise.   Time 1   Period Days   Status Achieved              Plan - 07/09/16 1658    Clinical Impression Statement Patient was diagnosed on 07/01/16 with left invasive breast cancer. It measures 1 cm and is located in the upper outer quadrant, is ER/PR positive and HER2 negative.  Her multidisciplinary medical team met prior  to her assessment to determine a recommended treatment plan.  She is planning to have a left lumpectomy and sentinel node biopsy followed by radiation, Oncotype testing, and anti-estrogen therapy.  She may benefit from post op PT tpo regain shoulder ROM and reduce lymphedema risk. Due to her lack of comorbidities, her eval is of low complexity.   Rehab Potential Excellent   Clinical Impairments Affecting Rehab Potential none  PT Frequency One time visit   PT Treatment/Interventions Patient/family education;Therapeutic exercise   PT Next Visit Plan Will f/u after surgery to determine PT needs   PT Home Exercise Plan Post op shoulder ROM HEP   Consulted and Agree with Plan of Care Patient;Family member/caregiver   Family Member Consulted Husband      Patient will benefit from skilled therapeutic intervention in order to improve the following deficits and impairments:  Postural dysfunction, Decreased knowledge of precautions, Pain, Impaired UE functional use, Decreased range of motion  Visit Diagnosis: Carcinoma of upper-outer quadrant of left breast in female, estrogen receptor positive ([place]) - Plan: PT plan of care cert/re-cert  Abnormal posture - Plan: PT plan of care cert/re-cert   Patient will follow up at outpatient cancer rehab if needed following surgery.  If the patient requires physical therapy at that time, a specific plan will be dictated and sent to the referring physician for approval. The patient was educated today on appropriate basic range of motion exercises to begin post operatively and the importance of attending the After Breast Cancer class following surgery.  Patient was educated today on lymphedema risk reduction practices as it pertains to recommendations that will benefit the patient immediately following surgery.  She verbalized good understanding.  No additional physical therapy is indicated at this time.      Problem List Patient Active Problem List   Diagnosis  Date Noted  . Malignant neoplasm of upper-outer quadrant of left female breast ([place]) 07/02/2016  . Right knee pain 05/10/2015  . HYPERTENSION 05/17/2007  . INSOMNIA 05/17/2007  . HYPERCHOLESTEROLEMIA 05/14/2007  . MITRAL VALVE PROLAPSE 05/14/2007  . VENTRICULAR TACHYCARDIA 05/14/2007   Annia Friendly, PT 07/09/16 5:02 PM  [place], [place], [address] Phone: [phone]   Fax:  [phone]  Name: Doris Bray MRN: [id number] Date of Birth: [birth year]

## 2016-07-09 NOTE — Assessment & Plan Note (Signed)
06/30/2016: Left breast biopsy 2:00 position: IDC grade 1 with DCIS, ER 100%, PR 100%, Ki-67 10%, HER-2 negative ratio 1.58; mammogram revealed upper-outer quadrant nodule in the left breast 1 x 0.8 x 0.5 cm at 2:00 axilla negative, T1 BN 0 stage IA  Pathology and radiology counseling:Discussed with the patient, the details of pathology including the type of breast cancer,the clinical staging, the significance of ER, PR and HER-2/neu receptors and the implications for treatment. After reviewing the pathology in detail, we proceeded to discuss the different treatment options between surgery, radiation, chemotherapy, antiestrogen therapies.  Recommendations: Genetic testing will be performed because her mother was diagnosed with breast cancer age 80 1. Breast conserving surgery followed by 2. Oncotype DX testing to determine if chemotherapy would be of any benefit followed by 3. Adjuvant radiation therapy followed by 4. Adjuvant antiestrogen therapy  Oncotype counseling: I discussed Oncotype DX test. I explained to the patient that this is a 21 gene panel to evaluate patient tumors DNA to calculate recurrence score. This would help determine whether patient has high risk or intermediate risk or low risk breast cancer. She understands that if her tumor was found to be high risk, she would benefit from systemic chemotherapy. If low risk, no need of chemotherapy. If she was found to be intermediate risk, we would need to evaluate the score as well as other risk factors and determine if an abbreviated chemotherapy may be of benefit.  Return to clinic after surgery to discuss final pathology report and then determine if Oncotype DX testing will need to be sent.

## 2016-07-09 NOTE — Patient Instructions (Signed)

## 2016-07-09 NOTE — Progress Notes (Signed)
Oh he definitely welcome to stay [place] NOTE  Patient Care Team: [name], MD as PCP - General (Family Medicine) [name], MD as Consulting Physician (General Surgery) [name], MD as Consulting Physician (Hematology and Oncology) [name], MD as Consulting Physician (Radiation Oncology)  CHIEF COMPLAINTS/PURPOSE OF CONSULTATION:  Newly diagnosed breast cancer  HISTORY OF PRESENTING ILLNESS:  Doris Bray 55 y.o. female is here because of recent diagnosis of left breast cancer. Patient had a screening mammogram that revealed an upper outer quadrant nodule in the left breast measuring 1 cm. Axilla was negative by ultrasound. This was biopsied and was found to be grade 1 invasive ductal carcinoma with DCIS that was ER/PR positive HER-2 negative. She was presented this morning in the multidisciplinary tumor board and she is here today to discuss a treatment plan. 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  I reviewed her records extensively and collaborated the history with the patient.  SUMMARY OF ONCOLOGIC HISTORY:   Malignant neoplasm of upper-outer quadrant of left female breast ([place])   07/01/2016 Initial Diagnosis    Left breast biopsy 2:00 position: IDC grade 1 with DCIS, ER 100%, PR 100%, Ki-67 10%, HER-2 negative ratio 1.58; mammogram revealed upper-outer quadrant nodule in the left breast 1 x 0.8 x 0.5 cm at 2:00 axilla negative, T1 BN 0 stage IA       In terms of breast cancer risk profile:  She menarched at early age of 53 and is perimenopausal last period was 3 months ago  She had 2 pregnancy, her first child was born at age 4  She has received birth control pills for approximately 3 years.  She was never exposed to fertility medications or hormone replacement therapy.  She has  family history of Breast/GYN/GI cancer Mother diagnosed breast cancer age 46  MEDICAL HISTORY:  Past Medical History:   Diagnosis Date  . Bigeminy   . Malignant neoplasm of upper-outer quadrant of left female breast ([place]) 07/02/2016  . Ventricular tachycardia ([place])     SURGICAL HISTORY: Past Surgical History:  Procedure Laterality Date  . CESAREAN SECTION     x2    SOCIAL HISTORY: Social History   Social History  . Marital status: Divorced    Spouse name: N/A  . Number of children: N/A  . Years of education: N/A   Occupational History  . Not on file.   Social History Main Topics  . Smoking status: Never Smoker  . Smokeless tobacco: Never Used  . Alcohol use 0.0 oz/week  . Drug use: No  . Sexual activity: Not on file   Other Topics Concern  . Not on file   Social History Narrative  . No narrative on file    FAMILY HISTORY: Family History  Problem Relation Age of Onset  . Breast cancer Mother 87    ALLERGIES:  has No Known Allergies.  MEDICATIONS:  Current Outpatient Prescriptions  Medication Sig Dispense Refill  . estradiol (ESTRACE) 0.5 MG tablet Take 0.5 mg by mouth daily.     No current facility-administered medications for this visit.     REVIEW OF SYSTEMS:   Constitutional: Denies fevers, chills or abnormal night sweats Eyes: Denies blurriness of vision, double vision or watery eyes Ears, nose, mouth, throat, and face: Denies mucositis or sore throat Respiratory: Denies cough, dyspnea or wheezes Cardiovascular: Denies palpitation, chest discomfort or lower extremity swelling Gastrointestinal:  Denies nausea, heartburn or change  in bowel habits Skin: Denies abnormal skin rashes Lymphatics: Denies new lymphadenopathy or easy bruising Neurological:Denies numbness, tingling or new weaknesses Behavioral/Psych: Mood is stable, no new changes  Breast:  Denies any palpable lumps or discharge All other systems were reviewed with the patient and are negative.  PHYSICAL EXAMINATION: ECOG PERFORMANCE STATUS: 0 - Asymptomatic  Vitals:   07/09/16 1250  BP: (!) 161/69   Pulse: 87  Resp: 18  Temp: 98.1 F (36.7 C)   Filed Weights   07/09/16 1250  Weight: 184 lb 4.8 oz (83.6 kg)    GENERAL:alert, no distress and comfortable SKIN: skin color, texture, turgor are normal, no rashes or significant lesions EYES: normal, conjunctiva are pink and non-injected, sclera clear OROPHARYNX:no exudate, no erythema and lips, buccal mucosa, and tongue normal  NECK: supple, thyroid normal size, non-tender, without nodularity LYMPH:  no palpable lymphadenopathy in the cervical, axillary or inguinal LUNGS: clear to auscultation and percussion with normal breathing effort HEART: regular rate & rhythm and no murmurs and no lower extremity edema ABDOMEN:abdomen soft, non-tender and normal bowel sounds Musculoskeletal:no cyanosis of digits and no clubbing  PSYCH: alert & oriented x 3 with fluent speech NEURO: no focal motor/sensory deficits BREAST: No palpable nodules in breast. No palpable axillary or supraclavicular lymphadenopathy (exam performed in the presence of a chaperone)   LABORATORY DATA:  I have reviewed the data as listed Lab Results  Component Value Date   WBC 6.4 07/09/2016   HGB 13.8 07/09/2016   HCT 41.2 07/09/2016   MCV 94.8 07/09/2016   PLT 221 07/09/2016   Lab Results  Component Value Date   NA 138 07/09/2016   K 4.1 07/09/2016   CO2 26 07/09/2016    RADIOGRAPHIC STUDIES: I have personally reviewed the radiological reports and agreed with the findings in the report.  ASSESSMENT AND PLAN:  Malignant neoplasm of upper-outer quadrant of left female breast ([place]) 06/30/2016: Left breast biopsy 2:00 position: IDC grade 1 with DCIS, ER 100%, PR 100%, Ki-67 10%, HER-2 negative ratio 1.58; mammogram revealed upper-outer quadrant nodule in the left breast 1 x 0.8 x 0.5 cm at 2:00 axilla negative, T1 BN 0 stage IA  Pathology and radiology counseling:Discussed with the patient, the details of pathology including the type of breast cancer,the  clinical staging, the significance of ER, PR and HER-2/neu receptors and the implications for treatment. After reviewing the pathology in detail, we proceeded to discuss the different treatment options between surgery, radiation, chemotherapy, antiestrogen therapies.  Recommendations: Genetic testing will be performed because her mother was diagnosed with breast cancer age 66 1. Breast conserving surgery followed by 2. Oncotype DX testing to determine if chemotherapy would be of any benefit followed by 3. Adjuvant radiation therapy followed by 4. Adjuvant antiestrogen therapy  Oncotype counseling: I discussed Oncotype DX test. I explained to the patient that this is a 21 gene panel to evaluate patient tumors DNA to calculate recurrence score. This would help determine whether patient has high risk or intermediate risk or low risk breast cancer. She understands that if her tumor was found to be high risk, she would benefit from systemic chemotherapy. If low risk, no need of chemotherapy. If she was found to be intermediate risk, we would need to evaluate the score as well as other risk factors and determine if an abbreviated chemotherapy may be of benefit.  Return to clinic after surgery to discuss final pathology report and then determine if Oncotype DX testing will need to be  sent. Patient stays very busy as she works for [employer].    All questions were answered. The patient knows to call the clinic with any problems, questions or concerns.    Rulon Eisenmenger, MD 07/09/16

## 2016-07-11 ENCOUNTER — Other Ambulatory Visit: Payer: Self-pay | Admitting: General Surgery

## 2016-07-11 DIAGNOSIS — C50412 Malignant neoplasm of upper-outer quadrant of left female breast: Secondary | ICD-10-CM

## 2016-07-11 DIAGNOSIS — Z170 Estrogen receptor positive status [ER+]: Secondary | ICD-10-CM

## 2016-07-15 ENCOUNTER — Telehealth: Payer: Self-pay | Admitting: *Deleted

## 2016-07-15 NOTE — Telephone Encounter (Signed)
Left vm concerning [place] from 07/09/16. Contact information provided.

## 2016-07-17 ENCOUNTER — Encounter (HOSPITAL_COMMUNITY)
Admission: RE | Admit: 2016-07-17 | Discharge: 2016-07-17 | Disposition: A | Discharge status: Home / Self Care | Payer: BLUE CROSS/BLUE SHIELD | Source: Ambulatory Visit | Attending: General Surgery | Admitting: General Surgery

## 2016-07-17 ENCOUNTER — Encounter (HOSPITAL_COMMUNITY): Payer: Self-pay | Admitting: *Deleted

## 2016-07-17 DIAGNOSIS — Z01810 Encounter for preprocedural cardiovascular examination: Secondary | ICD-10-CM | POA: Insufficient documentation

## 2016-07-17 DIAGNOSIS — Z01812 Encounter for preprocedural laboratory examination: Secondary | ICD-10-CM | POA: Insufficient documentation

## 2016-07-17 LAB — CBC
HEMATOCRIT: 39.3 % (ref 36.0–46.0)
Hemoglobin: 13.7 g/dL (ref 12.0–15.0)
MCH: 32.4 pg (ref 26.0–34.0)
MCHC: 34.9 g/dL (ref 30.0–36.0)
MCV: 92.9 fL (ref 78.0–100.0)
PLATELETS: 227 10*3/uL (ref 150–400)
RBC: 4.23 MIL/uL (ref 3.87–5.11)
RDW: 12.2 % (ref 11.5–15.5)
WBC: 6.7 10*3/uL (ref 4.0–10.5)

## 2016-07-17 LAB — BASIC METABOLIC PANEL
Anion gap: 8 (ref 5–15)
BUN: 14 mg/dL (ref 6–20)
CALCIUM: 9.4 mg/dL (ref 8.9–10.3)
CO2: 27 mmol/L (ref 22–32)
Chloride: 103 mmol/L (ref 101–111)
Creatinine, Ser: 0.76 mg/dL (ref 0.44–1.00)
GFR calc Af Amer: >60 mL/min (ref >60)
Glucose, Bld: 134 mg/dL — ABNORMAL HIGH (ref 65–99)
POTASSIUM: 3.8 mmol/L (ref 3.5–5.1)
SODIUM: 138 mmol/L (ref 135–145)

## 2016-07-17 NOTE — Progress Notes (Signed)
PCp is Dr. [name] Cardiologist is Dr. Rollene Fare with [facility]  Last saw him within last 39 years. States she had a stress test and echo when she last saw Dr. Carole Civil States she had a heart cath 1989 or 1990-( in [place]) states they tried to do electoral studies on her heart, but everything was fine. She has V tach while pregnant, none since, states she does feel "like a skipped beat at times". Instructed to drink Boost on day of surgery as ordered.

## 2016-07-17 NOTE — Pre-Procedure Instructions (Signed)
SARAYU BOYTE  07/17/2016      [id number] - [address], [place] [address] Phone: [phone] Fax: [phone]    Your procedure is scheduled on Dec 13  Report to [facility] at [place] A.M.  Call this number if you have problems the morning of surgery:  [phone]   Remember:  Do not eat food or drink liquids after midnight.  Take these medicines the morning of surgery with A SIP OF WATER na  Stop taking aspirin, BC's, Goody's, Herbal medications, Fish Oil, Ibuprofen, Advil, Motrin, Aleve, Vitamins   Do not wear jewelry, make-up or nail polish.  Do not wear lotions, powders, or perfumes, or deoderant.  Do not shave 48 hours prior to surgery.  Men may shave face and neck.  Do not bring valuables to the hospital.  [facility] is not responsible for any belongings or valuables.  Contacts, dentures or bridgework may not be worn into surgery.  Leave your suitcase in the car.  After surgery it may be brought to your room.  For patients admitted to the hospital, discharge time will be determined by your treatment team.  Patients discharged the day of surgery will not be allowed to drive home.    Special instructions:  [facility] - Preparing for Surgery  Before surgery, you can play an important role.  Because skin is not sterile, your skin needs to be as free of germs as possible.  You can reduce the number of germs on you skin by washing with CHG (chlorahexidine gluconate) soap before surgery.  CHG is an antiseptic cleaner which kills germs and bonds with the skin to continue killing germs even after washing.  Please DO NOT use if you have an allergy to CHG or antibacterial soaps.  If your skin becomes reddened/irritated stop using the CHG and inform your nurse when you arrive at Short Stay.  Do not shave (including legs and underarms) for at least 48 hours prior to the first CHG shower.  You  may shave your face.  Please follow these instructions carefully:   1.  Shower with CHG Soap the night before surgery and the     morning of Surgery.  2.  If you choose to wash your hair, wash your hair first as usual with your normal shampoo.  3.  After you shampoo, rinse your hair and body thoroughly to remove the Shampoo.  4.  Use CHG as you would any other liquid soap.  You can apply chg directly   to the skin and wash gently with scrungie or a clean washcloth.  5.  Apply the CHG Soap to your body ONLY FROM THE NECK DOWN.   Do not use on open wounds or open sores.  Avoid contact with your eyes,  ears, mouth and genitals (private parts).  Wash genitals (private parts)   with your normal soap.  6.  Wash thoroughly, paying special attention to the area where your surgery   will be performed.  7.  Thoroughly rinse your body with warm water from the neck down.  8.  DO NOT shower/wash with your normal soap after using and rinsing off   the CHG Soap.  9.  Pat yourself dry with a clean towel.            10.  Wear clean pajamas.            11.  Place clean  sheets on your bed the night of your first shower and do not sleep with pets.  Day of Surgery  Do not apply any lotions/deoderants the morning of surgery.  Please wear clean clothes to the hospital/surgery center.     Please read over the following fact sheets that you were given. Pain Booklet, Coughing and Deep Breathing and Surgical Site Infection Prevention

## 2016-07-18 ENCOUNTER — Ambulatory Visit
Admission: RE | Admit: 2016-07-18 | Discharge: 2016-07-18 | Disposition: A | Discharge status: Home / Self Care | Payer: BLUE CROSS/BLUE SHIELD | Source: Ambulatory Visit | Attending: General Surgery | Admitting: General Surgery

## 2016-07-18 DIAGNOSIS — Z170 Estrogen receptor positive status [ER+]: Secondary | ICD-10-CM

## 2016-07-18 DIAGNOSIS — C50412 Malignant neoplasm of upper-outer quadrant of left female breast: Secondary | ICD-10-CM

## 2016-07-18 NOTE — Progress Notes (Addendum)
Anesthesia Chart Review: Patient is a 55 year old female scheduled for left breast lumpectomy with radioactive seed and sentinel LN biopsy, inject blue dye left breast on 07/23/16 by Dr. [name]. By encounters, it looks like she is receiving her seed implant today. Posted for general anesthesia with pectoral block.  History includes left breast cancer, non-smoker, "V tach" (while pregnant), bigeminy. Reportedly had a cardiac cath in [place] around 1989/1990. Was told everything was "fine." She saw cardiologist Dr. Rollene Fare (now retired) approximately 12 years ago with stress and echo. No known recurrent VT, but does report an occasional feeling of "skipped beat."    PCP is Dr. [name].  HEM-ONC is Dr. [name].   Meds include Adderall XR, Advil PM.  BP (!) 159/82   Pulse 94   Temp 36.4 C (Oral)   Resp 20   Ht 5\' 9"  (1.753 m)   Wt 181 lb 10.5 oz (82.4 kg)   SpO2 100%   BMI 26.83 kg/m   07/17/16 EKG: NSR, septal infarct (age undetermined). There are no comparison tracing in Epic or Muse.  Preoperative labs noted. CBC and BMET WNL except glucose 134.  The details of her arrhythmia history are not entirely clear, but appears issues were at least 12 years ago. By notes, appears to have been in the setting of pregnancy. Yesterday's EKG showed NSR. PCP records requested. I've asked nursing staff to bring me any additional records if received. In the interim, I reviewed available records with anesthesiologist Dr. Conrad [place]. If no acute changes it is anticipated that she can proceed as planned. (Update: No additional details about cardiac history in records received from Dr. Rex Kras on 07/21/16.)  Myra Gianotti, PA-C [facility] Short Stay Center/Anesthesiology Phone [phone] 07/18/2016 4:24 PM

## 2016-07-20 NOTE — H&P (Signed)
Doris Bray Location: [facility] Patient #: [id number] DOB: 08-11-1961 Undefined / Language: Cleophus Molt / Race: White Female        History of Present Illness       This is a healthy, pleasant 55 year old Caucasian female, referred by Dr. Franki Cabot at the breast center of [facility] for evaluation of a new breast cancer left breast upper outer quadrant. Dr. [name] is her PCP. Dr. Vanessa Kick is her gynecologist. She is being seen in the [facility] today by Dr. Lindi Adie, Dr. Sondra Come, and me.      She has no prior history of breast problems. Gets annual mammograms. Asymptomatic. Recent imaging studies showed a category B density. 10 mm mass in the left breast upper outer quadrant, 2:00 position, 4 cm from the nipple. Ultrasound of the left axilla is negative. Image guided biopsy shows grade 1 invasive ductal carcinoma and DCIS. Estrogen receptor and progesterone receptor both 100%, KS 67 2%, HER-2 negative.      Comorbidities minimal. 2 C-sections through Pfannenstiel incision. Family history reveals mother was diagnosed with breast cancer age 54. Survivor. No other breast or ovarian cancer in the family. She is divorced but has a significant other with her. She is a Tax adviser. Lives in [place]. Denies tobacco. Has 1 son and 1 daughter.       We discussed the surgical options including mastectomy with or without reconstruction, lumpectomy, sentinel lymph node biopsy. She very clearly would like to have breast conservation surgery and I think she is an excellent candidate for that.      She'll be scheduled for left breast lumpectomy with radioactive seed localization and left axillary sentinel lymph node biopsy. I have discussed the indications, details, techniques, and numerous risk of the surgery with her. She is aware the risk of bleeding, infection, reoperation for positive nodes or positive margins, cosmetic deformity, nerve damage with  chronic pain, and other unforseen problems. She is aware the risk of arm swelling and arm numbness. She understands all these issues. All of her questions are answered. She agrees with this plan.  She will be referred to genetic counseling after surgery. Oncotype will be obtained She is advised to discontinue hormone replacement therapy She will likely receive whole breast radiation therapy in the adjuvant setting   Other Problems Breast Cancer  High blood pressure  Hypercholesterolemia  Lump In Breast   Past Surgical History  Breast Biopsy  Left. Cesarean Section - Multiple   Diagnostic Studies History  Colonoscopy  within last year Mammogram  within last year Pap Smear  1-5 years ago  Medication History  Medications Reconciled  Social History  Alcohol use  Moderate alcohol use. Caffeine use  Coffee. No drug use  Tobacco use  Never smoker.  Family History  Alcohol Abuse  Father. Breast Cancer  Mother. Cerebrovascular Accident  Father. Diabetes Mellitus  Family Members In General. Hypertension  Father.  Pregnancy / Birth History  Age at menarche  26 years. Age of menopause  [age] Contraceptive History  Oral contraceptives. Gravida  2 Length (months) of breastfeeding  3-6 Maternal age  78-30 Para  2    Review of Systems  General Present- Night Sweats. Not Present- Appetite Loss, Chills, Fatigue, Fever, Weight Gain and Weight Loss. HEENT Present- Wears glasses/contact lenses. Not Present- Earache, Hearing Loss, Hoarseness, Nose Bleed, Oral Ulcers, Ringing in the Ears, Seasonal Allergies, Sinus Pain, Sore Throat, Visual Disturbances and Yellow Eyes. Respiratory Present- Snoring. Not Present- Bloody  sputum, Chronic Cough, Difficulty Breathing and Wheezing. Breast Not Present- Breast Mass, Breast Pain, Nipple Discharge and Skin Changes. Cardiovascular Not Present- Chest Pain, Difficulty Breathing Lying Down, Leg Cramps, Palpitations,  Rapid Heart Rate, Shortness of Breath and Swelling of Extremities. Gastrointestinal Not Present- Abdominal Pain, Bloating, Bloody Stool, Change in Bowel Habits, Chronic diarrhea, Constipation, Difficulty Swallowing, Excessive gas, Gets full quickly at meals, Hemorrhoids, Indigestion, Nausea, Rectal Pain and Vomiting. Female Genitourinary Not Present- Frequency, Nocturia, Painful Urination, Pelvic Pain and Urgency. Musculoskeletal Not Present- Back Pain, Joint Pain, Joint Stiffness, Muscle Pain, Muscle Weakness and Swelling of Extremities. Neurological Not Present- Decreased Memory, Fainting, Headaches, Numbness, Seizures, Tingling, Tremor, Trouble walking and Weakness. Psychiatric Not Present- Anxiety, Bipolar, Change in Sleep Pattern, Depression, Fearful and Frequent crying. Endocrine Not Present- Cold Intolerance, Excessive Hunger, Hair Changes, Heat Intolerance, Hot flashes and New Diabetes. Hematology Not Present- Blood Thinners, Easy Bruising, Excessive bleeding, Gland problems, HIV and Persistent Infections.  Physical Exam   General Mental Status-Alert. General Appearance-Consistent with stated age. Hydration-Well hydrated. Voice-Normal.  Head and Neck Head-normocephalic, atraumatic with no lesions or palpable masses. Trachea-midline. Thyroid Gland Characteristics - normal size and consistency.  Eye Eyeball - Bilateral-Extraocular movements intact. Sclera/Conjunctiva - Bilateral-No scleral icterus.  Chest and Lung Exam Chest and lung exam reveals -quiet, even and easy respiratory effort with no use of accessory muscles and on auscultation, normal breath sounds, no adventitious sounds and normal vocal resonance. Inspection Chest Wall - Normal. Back - normal.  Breast Note: Breast are medium size. Minor ecchymoses left breast upper outer quadrant. No significant mass just a little thickening. No other mass, skin change, or adenopathy on either  side.   Cardiovascular Cardiovascular examination reveals -normal heart sounds, regular rate and rhythm with no murmurs and normal pedal pulses bilaterally.  Abdomen Inspection Inspection of the abdomen reveals - No Hernias. Skin - Scar - Note: Healed Pfannenstiel incision. Palpation/Percussion Palpation and Percussion of the abdomen reveal - Soft, Non Tender, No Rebound tenderness, No Rigidity (guarding) and No hepatosplenomegaly. Auscultation Auscultation of the abdomen reveals - Bowel sounds normal.  Neurologic Neurologic evaluation reveals -alert and oriented x 3 with no impairment of recent or remote memory. Mental Status-Normal.  Musculoskeletal Normal Exam - Left-Upper Extremity Strength Normal and Lower Extremity Strength Normal. Normal Exam - Right-Upper Extremity Strength Normal and Lower Extremity Strength Normal.  Lymphatic Head & Neck  General Head & Neck Lymphatics: Bilateral - Description - Normal. Axillary  General Axillary Region: Bilateral - Description - Normal. Tenderness - Non Tender. Femoral & Inguinal  Generalized Femoral & Inguinal Lymphatics: Bilateral - Description - Normal. Tenderness - Non Tender.    Assessment & Plan  PRIMARY CANCER OF UPPER OUTER QUADRANT OF LEFT FEMALE BREAST (C50.412)  Your recent imaging studies and left breast biopsy showed that you have a small, 10 mm diameter, grade 1 invasive ductal carcinoma of the left breast, upper outer quadrant. This tumor is estrogen and progesterone receptor positive, HER-2 negative. We have discussed her surgical options, including mastectomy with or without reconstruction, lumpectomy, and sentinel node biopsy, We have discussed the other modalities of radiation therapy, chemotherapy, antiestrogen therapy You are strongly advised to discontinue your hormonal replacement therapy  We have decided to proceed with scheduling of left breast lumpectomy with radioactive seed localization and  left axillary sentinel lymph node biopsy Dr. [name] has discussed the indications, techniques, and numerous risks of the surgery Please read the printed information that we have given you Dr. Darrel Hoover office will be  contacting you tomorrow to begin the scheduling process  HISTORY OF 2 CESAREAN SECTIONS (Z87.59)    Edsel Petrin. [name], M.D., [facility] General and Minimally invasive Surgery Breast and Colorectal Surgery Office:   [phone] Pager:   [phone]

## 2016-07-23 ENCOUNTER — Ambulatory Visit (HOSPITAL_COMMUNITY): Payer: BLUE CROSS/BLUE SHIELD | Admitting: Certified Registered"

## 2016-07-23 ENCOUNTER — Encounter (HOSPITAL_COMMUNITY)
Admission: RE | Disposition: A | Discharge status: Home / Self Care | Payer: Self-pay | Source: Ambulatory Visit | Attending: General Surgery

## 2016-07-23 ENCOUNTER — Ambulatory Visit (HOSPITAL_COMMUNITY): Payer: BLUE CROSS/BLUE SHIELD | Admitting: Vascular Surgery

## 2016-07-23 ENCOUNTER — Ambulatory Visit (HOSPITAL_COMMUNITY)
Admission: RE | Admit: 2016-07-23 | Discharge: 2016-07-23 | Disposition: A | Discharge status: Home / Self Care | Payer: BLUE CROSS/BLUE SHIELD | Source: Ambulatory Visit | Attending: General Surgery | Admitting: General Surgery

## 2016-07-23 ENCOUNTER — Encounter (HOSPITAL_COMMUNITY): Payer: Self-pay | Admitting: Certified Registered"

## 2016-07-23 ENCOUNTER — Ambulatory Visit
Admission: RE | Admit: 2016-07-23 | Discharge: 2016-07-23 | Disposition: A | Discharge status: Home / Self Care | Payer: BLUE CROSS/BLUE SHIELD | Source: Ambulatory Visit | Attending: General Surgery | Admitting: General Surgery

## 2016-07-23 DIAGNOSIS — C50919 Malignant neoplasm of unspecified site of unspecified female breast: Secondary | ICD-10-CM

## 2016-07-23 DIAGNOSIS — I10 Essential (primary) hypertension: Secondary | ICD-10-CM | POA: Insufficient documentation

## 2016-07-23 DIAGNOSIS — C50412 Malignant neoplasm of upper-outer quadrant of left female breast: Secondary | ICD-10-CM

## 2016-07-23 DIAGNOSIS — Z803 Family history of malignant neoplasm of breast: Secondary | ICD-10-CM | POA: Insufficient documentation

## 2016-07-23 DIAGNOSIS — Z170 Estrogen receptor positive status [ER+]: Secondary | ICD-10-CM

## 2016-07-23 DIAGNOSIS — E7800 Pure hypercholesterolemia, unspecified: Secondary | ICD-10-CM | POA: Diagnosis not present

## 2016-07-23 HISTORY — PX: BREAST LUMPECTOMY: SHX2

## 2016-07-23 HISTORY — PX: BREAST LUMPECTOMY WITH RADIOACTIVE SEED AND SENTINEL LYMPH NODE BIOPSY: SHX6550

## 2016-07-23 HISTORY — DX: Malignant neoplasm of unspecified site of unspecified female breast: C50.919

## 2016-07-23 SURGERY — BREAST LUMPECTOMY WITH RADIOACTIVE SEED AND SENTINEL LYMPH NODE BIOPSY
Anesthesia: General | Site: Breast | Laterality: Left

## 2016-07-23 MED ORDER — DEXAMETHASONE SODIUM PHOSPHATE 10 MG/ML IJ SOLN
INTRAMUSCULAR | Status: DC | PRN
  Administered 2016-07-23: 4 mg via INTRAVENOUS

## 2016-07-23 MED ORDER — ACETAMINOPHEN 500 MG PO TABS
1000.0000 mg | ORAL_TABLET | ORAL | Status: AC
  Administered 2016-07-23: 1000 mg via ORAL
  Filled 2016-07-23: qty 2

## 2016-07-23 MED ORDER — HYDROMORPHONE HCL 1 MG/ML IJ SOLN
INTRAMUSCULAR | Status: AC
  Administered 2016-07-23: 0.5 mg via INTRAVENOUS
  Filled 2016-07-23: qty 0.5

## 2016-07-23 MED ORDER — ONDANSETRON HCL 4 MG/2ML IJ SOLN
INTRAMUSCULAR | Status: DC | PRN
  Administered 2016-07-23: 4 mg via INTRAVENOUS

## 2016-07-23 MED ORDER — CELECOXIB 200 MG PO CAPS
400.0000 mg | ORAL_CAPSULE | ORAL | Status: AC
  Administered 2016-07-23: 400 mg via ORAL
  Filled 2016-07-23: qty 2

## 2016-07-23 MED ORDER — CEFAZOLIN SODIUM-DEXTROSE 2-4 GM/100ML-% IV SOLN
2.0000 g | INTRAVENOUS | Status: AC
  Administered 2016-07-23: 2 g via INTRAVENOUS
  Filled 2016-07-23: qty 100

## 2016-07-23 MED ORDER — SODIUM CHLORIDE 0.9 % IJ SOLN
INTRAMUSCULAR | Status: AC
  Filled 2016-07-23: qty 10

## 2016-07-23 MED ORDER — MIDAZOLAM HCL 2 MG/2ML IJ SOLN
INTRAMUSCULAR | Status: AC
  Filled 2016-07-23: qty 2

## 2016-07-23 MED ORDER — TECHNETIUM TC 99M SULFUR COLLOID FILTERED: 1.0000 | Freq: Once | INTRAVENOUS | Status: DC | PRN

## 2016-07-23 MED ORDER — PROPOFOL 10 MG/ML IV BOLUS
INTRAVENOUS | Status: AC
  Filled 2016-07-23: qty 20

## 2016-07-23 MED ORDER — LACTATED RINGERS IV SOLN
INTRAVENOUS | Status: DC
  Administered 2016-07-23: 13:00:00 via INTRAVENOUS

## 2016-07-23 MED ORDER — KETOROLAC TROMETHAMINE 30 MG/ML IJ SOLN
INTRAMUSCULAR | Status: AC
  Administered 2016-07-23: 30 mg via INTRAVENOUS
  Filled 2016-07-23: qty 1

## 2016-07-23 MED ORDER — HYDROCODONE-ACETAMINOPHEN 5-325 MG PO TABS: 1.0000 | ORAL_TABLET | Freq: Four times a day (QID) | ORAL | 0 refills | Status: DC | PRN

## 2016-07-23 MED ORDER — DEXAMETHASONE SODIUM PHOSPHATE 10 MG/ML IJ SOLN
INTRAMUSCULAR | Status: AC
  Filled 2016-07-23: qty 1

## 2016-07-23 MED ORDER — HYDROMORPHONE HCL 1 MG/ML IJ SOLN
0.2500 mg | INTRAMUSCULAR | Status: DC | PRN
  Administered 2016-07-23: 0.5 mg via INTRAVENOUS

## 2016-07-23 MED ORDER — DIPHENHYDRAMINE HCL 50 MG/ML IJ SOLN
INTRAMUSCULAR | Status: DC | PRN
  Administered 2016-07-23: 10 mg via INTRAVENOUS

## 2016-07-23 MED ORDER — BUPIVACAINE-EPINEPHRINE (PF) 0.25% -1:200000 IJ SOLN
INTRAMUSCULAR | Status: AC
  Filled 2016-07-23: qty 30

## 2016-07-23 MED ORDER — BUPIVACAINE-EPINEPHRINE 0.25% -1:200000 IJ SOLN
INTRAMUSCULAR | Status: DC | PRN
  Administered 2016-07-23: 12 mL

## 2016-07-23 MED ORDER — GABAPENTIN 300 MG PO CAPS
300.0000 mg | ORAL_CAPSULE | ORAL | Status: AC
  Administered 2016-07-23: 300 mg via ORAL
  Filled 2016-07-23: qty 1

## 2016-07-23 MED ORDER — LIDOCAINE 2% (20 MG/ML) 5 ML SYRINGE
INTRAMUSCULAR | Status: AC
  Filled 2016-07-23: qty 5

## 2016-07-23 MED ORDER — MIDAZOLAM HCL 2 MG/2ML IJ SOLN: 2.0000 mg | Freq: Once | INTRAMUSCULAR | Status: DC

## 2016-07-23 MED ORDER — METHYLENE BLUE 0.5 % INJ SOLN
INTRAVENOUS | Status: AC
  Filled 2016-07-23: qty 10

## 2016-07-23 MED ORDER — DIPHENHYDRAMINE HCL 50 MG/ML IJ SOLN
INTRAMUSCULAR | Status: AC
  Filled 2016-07-23: qty 1

## 2016-07-23 MED ORDER — CHLORHEXIDINE GLUCONATE CLOTH 2 % EX PADS: 6.0000 | MEDICATED_PAD | Freq: Once | CUTANEOUS | Status: DC

## 2016-07-23 MED ORDER — PROMETHAZINE HCL 25 MG/ML IJ SOLN: 6.2500 mg | INTRAMUSCULAR | Status: DC | PRN

## 2016-07-23 MED ORDER — ONDANSETRON HCL 4 MG/2ML IJ SOLN
INTRAMUSCULAR | Status: AC
  Filled 2016-07-23: qty 2

## 2016-07-23 MED ORDER — LIDOCAINE 2% (20 MG/ML) 5 ML SYRINGE
INTRAMUSCULAR | Status: DC | PRN
Start: 2016-07-23 — End: 2016-07-23
  Administered 2016-07-23: 60 mg via INTRAVENOUS

## 2016-07-23 MED ORDER — 0.9 % SODIUM CHLORIDE (POUR BTL) OPTIME
TOPICAL | Status: DC | PRN
  Administered 2016-07-23: 1000 mL

## 2016-07-23 MED ORDER — FENTANYL CITRATE (PF) 100 MCG/2ML IJ SOLN
100.0000 ug | Freq: Once | INTRAMUSCULAR | Status: AC
  Administered 2016-07-23: 100 ug via INTRAVENOUS

## 2016-07-23 MED ORDER — BUPIVACAINE HCL (PF) 0.5 % IJ SOLN
INTRAMUSCULAR | Status: DC | PRN
  Administered 2016-07-23: 30 mL via PERINEURAL

## 2016-07-23 MED ORDER — FENTANYL CITRATE (PF) 100 MCG/2ML IJ SOLN
INTRAMUSCULAR | Status: AC
  Administered 2016-07-23: 100 ug via INTRAVENOUS
  Filled 2016-07-23: qty 2

## 2016-07-23 MED ORDER — FENTANYL CITRATE (PF) 100 MCG/2ML IJ SOLN
INTRAMUSCULAR | Status: AC
  Filled 2016-07-23: qty 2

## 2016-07-23 MED ORDER — PROPOFOL 10 MG/ML IV BOLUS
INTRAVENOUS | Status: DC | PRN
  Administered 2016-07-23: 200 mg via INTRAVENOUS

## 2016-07-23 MED ORDER — KETOROLAC TROMETHAMINE 30 MG/ML IJ SOLN
30.0000 mg | Freq: Once | INTRAMUSCULAR | Status: AC | PRN
  Administered 2016-07-23: 30 mg via INTRAVENOUS

## 2016-07-23 MED ORDER — SODIUM CHLORIDE 0.9 % IJ SOLN
INTRAVENOUS | Status: DC | PRN
  Administered 2016-07-23: 5 mL via INTRAMUSCULAR

## 2016-07-23 SURGICAL SUPPLY — 56 items
ADH SKN CLS APL DERMABOND .7 (GAUZE/BANDAGES/DRESSINGS) ×1
APPLIER CLIP 9.375 MED OPEN (MISCELLANEOUS) ×2
APR CLP MED 9.3 20 MLT OPN (MISCELLANEOUS) ×1
BINDER BREAST LRG (GAUZE/BANDAGES/DRESSINGS) IMPLANT
BINDER BREAST XLRG (GAUZE/BANDAGES/DRESSINGS) ×1 IMPLANT
BLADE SURG 15 STRL LF DISP TIS (BLADE) ×1 IMPLANT
BLADE SURG 15 STRL SS (BLADE) ×2
CANISTER SUCTION 2500CC (MISCELLANEOUS) ×2 IMPLANT
CHLORAPREP W/TINT 26ML (MISCELLANEOUS) ×2 IMPLANT
CLIP APPLIE 9.375 MED OPEN (MISCELLANEOUS) ×1 IMPLANT
CONT SPEC 4OZ CLIKSEAL STRL BL (MISCELLANEOUS) ×1 IMPLANT
COVER PROBE W GEL 5X96 (DRAPES) ×2 IMPLANT
COVER SURGICAL LIGHT HANDLE (MISCELLANEOUS) ×2 IMPLANT
DERMABOND ADVANCED (GAUZE/BANDAGES/DRESSINGS) ×1
DERMABOND ADVANCED .7 DNX12 (GAUZE/BANDAGES/DRESSINGS) ×1 IMPLANT
DEVICE DUBIN SPECIMEN MAMMOGRA (MISCELLANEOUS) ×2 IMPLANT
DRAPE CHEST BREAST 15X10 FENES (DRAPES) ×2 IMPLANT
DRAPE PROXIMA HALF (DRAPES) ×2 IMPLANT
DRAPE UTILITY XL STRL (DRAPES) ×2 IMPLANT
DRSG PAD ABDOMINAL 8X10 ST (GAUZE/BANDAGES/DRESSINGS) ×2 IMPLANT
ELECT CAUTERY BLADE 6.4 (BLADE) ×2 IMPLANT
ELECT REM PT RETURN 9FT ADLT (ELECTROSURGICAL) ×2
ELECTRODE REM PT RTRN 9FT ADLT (ELECTROSURGICAL) ×1 IMPLANT
GAUZE SPONGE 4X4 12PLY STRL (GAUZE/BANDAGES/DRESSINGS) ×2 IMPLANT
GLOVE BIOGEL PI IND STRL 7.0 (GLOVE) IMPLANT
GLOVE BIOGEL PI INDICATOR 7.0 (GLOVE) ×2
GLOVE EUDERMIC 7 POWDERFREE (GLOVE) ×3 IMPLANT
GLOVE SURG SS PI 6.5 STRL IVOR (GLOVE) ×1 IMPLANT
GOWN STRL REUS W/ TWL LRG LVL3 (GOWN DISPOSABLE) ×1 IMPLANT
GOWN STRL REUS W/ TWL XL LVL3 (GOWN DISPOSABLE) ×1 IMPLANT
GOWN STRL REUS W/TWL LRG LVL3 (GOWN DISPOSABLE) ×2
GOWN STRL REUS W/TWL XL LVL3 (GOWN DISPOSABLE) ×2
ILLUMINATOR WAVEGUIDE N/F (MISCELLANEOUS) IMPLANT
KIT BASIN OR (CUSTOM PROCEDURE TRAY) ×2 IMPLANT
KIT MARKER MARGIN INK (KITS) ×2 IMPLANT
LIGHT WAVEGUIDE WIDE FLAT (MISCELLANEOUS) IMPLANT
NDL FILTER BLUNT 18X1 1/2 (NEEDLE) IMPLANT
NDL HYPO 25X1 1.5 SAFETY (NEEDLE) ×1 IMPLANT
NDL SAFETY ECLIPSE 18X1.5 (NEEDLE) IMPLANT
NEEDLE FILTER BLUNT 18X 1/2SAF (NEEDLE) ×1
NEEDLE FILTER BLUNT 18X1 1/2 (NEEDLE) ×1 IMPLANT
NEEDLE HYPO 18GX1.5 SHARP (NEEDLE) ×2
NEEDLE HYPO 25X1 1.5 SAFETY (NEEDLE) ×2 IMPLANT
NS IRRIG 1000ML POUR BTL (IV SOLUTION) ×2 IMPLANT
PACK SURGICAL SETUP 50X90 (CUSTOM PROCEDURE TRAY) ×2 IMPLANT
PENCIL BUTTON HOLSTER BLD 10FT (ELECTRODE) ×2 IMPLANT
SPONGE LAP 18X18 X RAY DECT (DISPOSABLE) ×1 IMPLANT
SPONGE LAP 4X18 X RAY DECT (DISPOSABLE) ×2 IMPLANT
SUT MNCRL AB 4-0 PS2 18 (SUTURE) ×2 IMPLANT
SUT SILK 2 0 SH (SUTURE) IMPLANT
SUT VIC AB 3-0 SH 18 (SUTURE) ×2 IMPLANT
SYR BULB 3OZ (MISCELLANEOUS) ×2 IMPLANT
SYR CONTROL 10ML LL (SYRINGE) ×3 IMPLANT
TOWEL OR 17X26 10 PK STRL BLUE (TOWEL DISPOSABLE) ×2 IMPLANT
TUBE CONNECTING 12X1/4 (SUCTIONS) ×2 IMPLANT
YANKAUER SUCT BULB TIP NO VENT (SUCTIONS) ×2 IMPLANT

## 2016-07-23 NOTE — Anesthesia Postprocedure Evaluation (Signed)
Anesthesia Post Note  Patient: Doris Bray  Procedure(s) Performed: Procedure(s) (LRB): LEFT BREAST LUMPECTOMY WITH RADIOACTIVE SEED AND SENTINEL LYMPH NODE BIOPSY, INJECT BLUE DYE LEFT BREAST (Left)  Patient location during evaluation: PACU Anesthesia Type: General and Regional Level of consciousness: awake and alert Pain management: pain level controlled Vital Signs Assessment: post-procedure vital signs reviewed and stable Respiratory status: spontaneous breathing, nonlabored ventilation, respiratory function stable and patient connected to nasal cannula oxygen Cardiovascular status: blood pressure returned to baseline and stable Postop Assessment: no signs of nausea or vomiting Anesthetic complications: no    Last Vitals:  Vitals:   07/23/16 1549 07/23/16 1602  BP:  (!) 164/96  Pulse: 75 78  Resp: 15 16  Temp:      Last Pain:  Vitals:   07/23/16 1602  TempSrc:   PainSc: 0-No pain                 Ladislao Cohenour S

## 2016-07-23 NOTE — Anesthesia Procedure Notes (Addendum)
Anesthesia Regional Block:  Pectoralis block  Pre-Anesthetic Checklist: ,, timeout performed, Correct Patient, Correct Site, Correct Laterality, Correct Procedure, Correct Position, site marked, Risks and benefits discussed,  Surgical consent,  Pre-op evaluation,  At surgeon's request and post-op pain management  Laterality: Left  Prep: chloraprep       Needles:  Injection technique: Single-shot  Needle Type: Echogenic Needle     Needle Length: 9cm 9 cm Needle Gauge: 21 G    Additional Needles:  Procedures: ultrasound guided (picture in chart) Pectoralis block Narrative:  Start time: 07/23/2016 1:14 PM End time: 07/23/2016 1:24 PM Injection made incrementally with aspirations every 5 mL.  Performed by: Personally  Anesthesiologist: Harlee Eckroth  Additional Notes: Patient tolerated the procedure well without complications

## 2016-07-23 NOTE — Discharge Instructions (Signed)
[facility] °Office Phone Number [phone] ° °BREAST BIOPSY/ PARTIAL MASTECTOMY: POST OP INSTRUCTIONS ° °Always review your discharge instruction sheet given to you by the facility where your surgery was performed. ° °IF YOU HAVE DISABILITY OR FAMILY LEAVE FORMS, YOU MUST BRING THEM TO THE OFFICE FOR PROCESSING.  DO NOT GIVE THEM TO YOUR DOCTOR. ° °1. A prescription for pain medication may be given to you upon discharge.  Take your pain medication as prescribed, if needed.  If narcotic pain medicine is not needed, then you may take acetaminophen (Tylenol) or ibuprofen (Advil) as needed. °2. Take your usually prescribed medications unless otherwise directed °3. If you need a refill on your pain medication, please contact your pharmacy.  They will contact our office to request authorization.  Prescriptions will not be filled after 5pm or on week-ends. °4. You should eat very light the first 24 hours after surgery, such as soup, crackers, pudding, etc.  Resume your normal diet the day after surgery. °5. Most patients will experience some swelling and bruising in the breast.  Ice packs and a good support bra will help.  Swelling and bruising can take several days to resolve.  °6. It is common to experience some constipation if taking pain medication after surgery.  Increasing fluid intake and taking a stool softener will usually help or prevent this problem from occurring.  A mild laxative (Milk of Magnesia or Miralax) should be taken according to package directions if there are no bowel movements after 48 hours. °7. Unless discharge instructions indicate otherwise, you may remove your bandages 24-48 hours after surgery, and you may shower at that time.  You may have steri-strips (small skin tapes) in place directly over the incision.  These strips should be left on the skin for 7-10 days.  If your surgeon used skin glue on the incision, you may shower in 24 hours.  The glue will flake off over the  next 2-3 weeks.  Any sutures or staples will be removed at the office during your follow-up visit. °8. ACTIVITIES:  You may resume regular daily activities (gradually increasing) beginning the next day.  Wearing a good support bra or sports bra minimizes pain and swelling.  You may have sexual intercourse when it is comfortable. °a. You may drive when you no longer are taking prescription pain medication, you can comfortably wear a seatbelt, and you can safely maneuver your car and apply brakes. °b. RETURN TO WORK:  ______________________________________________________________________________________ °9. You should see your doctor in the office for a follow-up appointment approximately two weeks after your surgery.  Your doctor’s nurse will typically make your follow-up appointment when she calls you with your pathology report.  Expect your pathology report 2-3 business days after your surgery.  You may call to check if you do not hear from us after three days. °10. OTHER INSTRUCTIONS: _______________________________________________________________________________________________ _____________________________________________________________________________________________________________________________________ °_____________________________________________________________________________________________________________________________________ °_____________________________________________________________________________________________________________________________________ ° °WHEN TO CALL YOUR DOCTOR: °1. Fever over 101.0 °2. Nausea and/or vomiting. °3. Extreme swelling or bruising. °4. Continued bleeding from incision. °5. Increased pain, redness, or drainage from the incision. ° °The clinic staff is available to answer your questions during regular business hours.  Please don’t hesitate to call and ask to speak to one of the nurses for clinical concerns.  If you have a medical emergency, go to the nearest  emergency room or call 911.  A surgeon from [facility] is always on call at the hospital. ° °For further questions, please visit [email or web address]  °

## 2016-07-23 NOTE — Interval H&P Note (Signed)
History and Physical Interval Note:  07/23/2016 12:48 PM  Doris Bray  has presented today for surgery, with the diagnosis of LEFT BREAST CANCER  The various methods of treatment have been discussed with the patient and family. After consideration of risks, benefits and other options for treatment, the patient has consented to  Procedure(s): LEFT BREAST LUMPECTOMY WITH RADIOACTIVE SEED AND SENTINEL LYMPH NODE BIOPSY, INJECT BLUE DYE LEFT BREAST (Left) as a surgical intervention .  The patient's history has been reviewed, patient examined, no change in status, stable for surgery.  I have reviewed the patient's chart and labs.  Questions were answered to the patient's satisfaction.     Adin Hector

## 2016-07-23 NOTE — Anesthesia Procedure Notes (Signed)
Procedure Name: LMA Insertion Date/Time: 07/23/2016 1:35 PM Performed by: Melina Copa, Yobany Vroom R Pre-anesthesia Checklist: Patient identified, Emergency Drugs available, Suction available and Patient being monitored Patient Re-evaluated:Patient Re-evaluated prior to inductionOxygen Delivery Method: Circle System Utilized Preoxygenation: Pre-oxygenation with 100% oxygen Intubation Type: IV induction Ventilation: Mask ventilation without difficulty LMA: LMA inserted LMA Size: 4.0 and 5.0 Number of attempts: 1 Placement Confirmation: positive ETCO2 (4 LMA leaking at 15 cm H2O, removed and replaced with 5 LMA with good seal) Tube secured with: Tape Dental Injury: Teeth and Oropharynx as per pre-operative assessment

## 2016-07-23 NOTE — Transfer of Care (Signed)
Immediate Anesthesia Transfer of Care Note  Patient: Doris Bray  Procedure(s) Performed: Procedure(s): LEFT BREAST LUMPECTOMY WITH RADIOACTIVE SEED AND SENTINEL LYMPH NODE BIOPSY, INJECT BLUE DYE LEFT BREAST (Left)  Patient Location: PACU  Anesthesia Type:GA combined with regional for post-op pain  Level of Consciousness: awake, oriented and patient cooperative  Airway & Oxygen Therapy: Patient Spontanous Breathing and Patient connected to nasal cannula oxygen  Post-op Assessment: Report given to RN and Post -op Vital signs reviewed and stable  Post vital signs: Reviewed and stable  Last Vitals:  Vitals:   07/23/16 1315 07/23/16 1320  BP: (!) 160/92 (!) 165/85  Pulse: 74 77  Resp: (!) 9 20  Temp:      Last Pain:  Vitals:   07/23/16 1136  TempSrc: Oral      Patients Stated Pain Goal: 5 ([id number])  Complications: No apparent anesthesia complications

## 2016-07-23 NOTE — Anesthesia Preprocedure Evaluation (Addendum)
Anesthesia Evaluation  Patient identified by MRN, date of birth, ID band Patient awake    Reviewed: Allergy & Precautions, NPO status , Patient's Chart, lab work & pertinent test results  Airway Mallampati: II  TM Distance: >3 FB Neck ROM: Full    Dental no notable dental hx. (+) Teeth Intact, Dental Advisory Given   Pulmonary neg pulmonary ROS,    Pulmonary exam normal breath sounds clear to auscultation       Cardiovascular Normal cardiovascular exam Rhythm:Regular Rate:Normal     Neuro/Psych negative neurological ROS  negative psych ROS   GI/Hepatic negative GI ROS, Neg liver ROS,   Endo/Other  negative endocrine ROS  Renal/GU negative Renal ROS  negative genitourinary   Musculoskeletal negative musculoskeletal ROS (+)   Abdominal   Peds negative pediatric ROS (+)  Hematology negative hematology ROS (+)   Anesthesia Other Findings   Reproductive/Obstetrics negative OB ROS                            Anesthesia Physical Anesthesia Plan  ASA: II  Anesthesia Plan: General   Post-op Pain Management:  Regional for Post-op pain and GA combined w/ Regional for post-op pain   Induction: Intravenous  Airway Management Planned: LMA  Additional Equipment: None  Intra-op Plan:   Post-operative Plan: Extubation in OR  Informed Consent: I have reviewed the patients History and Physical, chart, labs and discussed the procedure including the risks, benefits and alternatives for the proposed anesthesia with the patient or authorized representative who has indicated his/her understanding and acceptance.   Dental advisory given  Plan Discussed with: CRNA, Anesthesiologist and Surgeon  Anesthesia Plan Comments:        Anesthesia Quick Evaluation

## 2016-07-23 NOTE — Op Note (Signed)
Patient Name:           Doris Bray   Date of Surgery:        07/23/2016  Pre op Diagnosis:      Invasive cancer left breast, upper outer quadrant, estrogen receptor positive.  HER-2 negative.  Clinical stage TIb, N0  Post op Diagnosis:    Same  Procedure:                 Inject blue dye left breast, left breast lumpectomy with radioactive seed localization, left axillary deep sentinel lymph node biopsy  Surgeon:                     Edsel Petrin. [name], M.D., FACS  Assistant:                      OR staff   Indication for Assistant: N/A  Operative Indications:    This is a healthy, pleasant 56 year old Caucasian female, referred by Dr. Franki Cabot at the breast center of [facility] for evaluation of a new breast cancer left breast upper outer quadrant. Dr. [name] is her PCP. Dr. Vanessa Kick is her gynecologist. She is being seen in the [facility] today by Dr. Lindi Adie, Dr. Sondra Come, and me.      She has no prior history of breast problems. Gets annual mammograms. Asymptomatic. Recent imaging studies showed a category B density. 10 mm mass in the left breast upper outer quadrant, 2:00 position, 4 cm from the nipple. Ultrasound of the left axilla is negative. Image guided biopsy shows grade 1 invasive ductal carcinoma and DCIS. Estrogen receptor and progesterone receptor both 100%, KS 67 2%, HER-2 negative.      Comorbidities minimal. 2 C-sections through Pfannenstiel incision.      Family history reveals mother was diagnosed with breast cancer age 45. Survivor. No other breast or ovarian cancer in the family.r.       We discussed the surgical options including mastectomy with or without reconstruction, lumpectomy, sentinel lymph node biopsy. She very clearly would like to have breast conservation surgery and I think she is an excellent candidate for that.      She'll be scheduled for left breast lumpectomy with radioactive seed localization and left axillary sentinel lymph node  biopsy. I have discussed the indications, details, techniques, and numerous risk of the surgery with her. She is aware the risk of bleeding, infection, reoperation for positive nodes or positive margins, cosmetic deformity, nerve damage with chronic pain, and other unforseen problems. She is aware the risk of arm swelling and arm numbness. She understands all these issues. All of her questions are answered. She agrees with this plan.     She will be referred to genetic counseling after surgery.     Oncotype will be obtained     She is advised to discontinue hormone replacement therapy      She will likely receive whole breast radiation therapy in the adjuvant setting  Operative Findings:       The patient underwent a left pectoral block by the anesthesia department in the holding area.  The patient underwent injection of technetium 99 by the nuclear medicine technician in the holding area.  I was able to hear the audible signal of the radioactive seed using the neoprobe in the holding area.  In the operating room the cancer was found to be reasonably high in the upper outer quadrant of the left breast.  The cancer was in the posterior third of the breast, and the broad posterior margin of the lumpectomy is the pectoralis muscle.  The specimen mammogram looked very good with the initial biopsy marker clip and the radioactive seed in the center of the specimen.  There was only 1 sentinel lymph node, but it was clearly hot and blue.  There was no significant adenopathy by gross palpation.  Procedure in Detail:          Following the induction of general LMA anesthesia a surgical timeout was performed.  Intravenous antibiotics were given.  Following alcohol prep I injected 5 mL of dilute methylene blue into the left breast, subareolar area, and massage the breast for a few minutes.  The left breast,   left chest wall and axilla were then prepped and draped in a sterile fashion.  0.25% Marcaine with  epinephrine was used as local infiltration anesthetic into the  skin and subcutaneous  Tissues.      Using the neoprobe I  isolated the radioactive signal, maximal intensity at the 2:00 position about 8 cm lateral to the areolar margin.  Curvilinear incision was made in this location.  Dissection was carried down into the breast using the neoprobe as a guide frequently.  The lumpectomy was performed.  The lumpectomy specimen was marked with silk sutures and a 6 color ink kit to orient the pathologist.  The specimen mammogram looked very good as described above.  Hemostasis was excellent.  The wound was irrigated.  Metal marker clips were  placed at the 5 cardinal positions of the lumpectomy cavity.  The breast tissues were reapproximated with 3-0 Vicryl sutures and the skin closed with running subcuticular 4-0 Monocryl and Dermabond.       A transverse incision was made at the hairline of the left axilla.  Dissection was carried down through the clavipectoral fascia.  The axillary space was entered.  Using the neoprobe I isolated a single sentinel lymph node that was very hot and very blue and removed this.  After this was removed there was no more radioactivity and I did not see any more blue lymphatics.  There is no palpable abnormality in the axilla.  Hemostasis was excellent in the left axilla.  It was irrigated and closed with 3-0 Vicryl sutures and 4-0 subcuticular Monocryl and Dermabond.  Clean bandages and a breast binder were placed.  The patient tolerated the procedure well and was taken to PACU in stable condition.  EBL 20 mL or less.  Counts correct.  Complications none.          Edsel Petrin. [name], M.D., FACS General and Minimally Invasive Surgery Breast and Colorectal Surgery  07/23/2016 2:49 PM

## 2016-07-24 ENCOUNTER — Encounter (HOSPITAL_COMMUNITY): Payer: Self-pay | Admitting: General Surgery

## 2016-07-28 NOTE — Addendum Note (Signed)
Addendum  created 07/28/16 0816 by Myrtie Soman, MD   Anesthesia Intra Blocks edited, Sign clinical note

## 2016-07-30 ENCOUNTER — Encounter: Payer: Self-pay | Admitting: Hematology and Oncology

## 2016-07-30 ENCOUNTER — Ambulatory Visit (HOSPITAL_BASED_OUTPATIENT_CLINIC_OR_DEPARTMENT_OTHER): Payer: BLUE CROSS/BLUE SHIELD | Admitting: Hematology and Oncology

## 2016-07-30 ENCOUNTER — Telehealth: Payer: Self-pay | Admitting: *Deleted

## 2016-07-30 DIAGNOSIS — Z170 Estrogen receptor positive status [ER+]: Secondary | ICD-10-CM

## 2016-07-30 DIAGNOSIS — C50412 Malignant neoplasm of upper-outer quadrant of left female breast: Secondary | ICD-10-CM

## 2016-07-30 NOTE — Progress Notes (Signed)
Patient Care Team: [name], MD as PCP - General (Family Medicine) [name], MD as Consulting Physician (General Surgery) [name], MD as Consulting Physician (Hematology and Oncology) [name], MD as Consulting Physician (Radiation Oncology)  DIAGNOSIS:  Encounter Diagnosis  Name Primary?  . Malignant neoplasm of upper-outer quadrant of left breast in female, estrogen receptor positive ([place])     SUMMARY OF ONCOLOGIC HISTORY:   Malignant neoplasm of upper-outer quadrant of left female breast ([place])   07/01/2016 Initial Diagnosis    Left breast biopsy 2:00 position: IDC grade 1 with DCIS, ER 100%, PR 100%, Ki-67 10%, HER-2 negative ratio 1.58; mammogram revealed upper-outer quadrant nodule in the left breast 1 x 0.8 x 0.5 cm at 2:00 axilla negative, T1 BN 0 stage IA      07/23/2016 Surgery    Left lumpectomy: IDC grade 1, 1 cm, with low-grade DCIS, margins negative, 0/1 lymph node negative, ER 100%, PR 100%, HER-2 negative ratio 1.58, Ki-67 10% T1 BN 0 stage IA        CHIEF COMPLIANT: Follow-up after recent lumpectomy  INTERVAL HISTORY: Doris Bray is a 78 year with above-mentioned history left breast cancer underwent lumpectomy and is here to discuss the pathology report. She is recovered very well from surgery. She had a bit of bruising and may have had small amount of bleeding into the breast causing swelling of the breast initially but it appears to be getting away.  REVIEW OF SYSTEMS:   Constitutional: Denies fevers, chills or abnormal weight loss Eyes: Denies blurriness of vision Ears, nose, mouth, throat, and face: Denies mucositis or sore throat Respiratory: Denies cough, dyspnea or wheezes Cardiovascular: Denies palpitation, chest discomfort Gastrointestinal:  Denies nausea, heartburn or change in bowel habits Skin: Denies abnormal skin rashes Lymphatics: Denies new lymphadenopathy or easy bruising Neurological:Denies numbness, tingling or  new weaknesses Behavioral/Psych: Mood is stable, no new changes  Extremities: No lower extremity edema Breast: Recent lumpectomy denies any pain or lumps or nodules in either breasts All other systems were reviewed with the patient and are negative.  I have reviewed the past medical history, past surgical history, social history and family history with the patient and they are unchanged from previous note.  ALLERGIES:  is allergic to no known allergies.  MEDICATIONS:  Current Outpatient Prescriptions  Medication Sig Dispense Refill  . amphetamine-dextroamphetamine (ADDERALL XR) 20 MG 24 hr capsule Take 20 mg by mouth daily as needed (attention).    Marland Kitchen HYDROcodone-acetaminophen (NORCO) 5-325 MG tablet Take 1-2 tablets by mouth every 6 (six) hours as needed for moderate pain or severe pain. 30 tablet 0  . Ibuprofen-Diphenhydramine Cit (ADVIL PM PO) Take 2 tablets by mouth at bedtime.     No current facility-administered medications for this visit.     PHYSICAL EXAMINATION: ECOG PERFORMANCE STATUS: 1 - Symptomatic but completely ambulatory  Vitals:   07/30/16 1133  BP: (!) 148/75  Pulse: 80  Resp: 18  Temp: 97.8 F (36.6 C)   Filed Weights   07/30/16 1133  Weight: 185 lb (83.9 kg)    GENERAL:alert, no distress and comfortable SKIN: skin color, texture, turgor are normal, no rashes or significant lesions EYES: normal, Conjunctiva are pink and non-injected, sclera clear OROPHARYNX:no exudate, no erythema and lips, buccal mucosa, and tongue normal  NECK: supple, thyroid normal size, non-tender, without nodularity LYMPH:  no palpable lymphadenopathy in the cervical, axillary or inguinal LUNGS: clear to auscultation and percussion with normal breathing effort HEART: regular  rate & rhythm and no murmurs and no lower extremity edema ABDOMEN:abdomen soft, non-tender and normal bowel sounds MUSCULOSKELETAL:no cyanosis of digits and no clubbing  NEURO: alert & oriented x 3 with fluent  speech, no focal motor/sensory deficits EXTREMITIES: No lower extremity edema  LABORATORY DATA:  I have reviewed the data as listed   Chemistry      Component Value Date/Time   NA 138 07/17/2016 1252   NA 138 07/09/2016 1229   K 3.8 07/17/2016 1252   K 4.1 07/09/2016 1229   CL 103 07/17/2016 1252   CO2 27 07/17/2016 1252   CO2 26 07/09/2016 1229   BUN 14 07/17/2016 1252   BUN 15.7 07/09/2016 1229   CREATININE 0.76 07/17/2016 1252   CREATININE 0.8 07/09/2016 1229      Component Value Date/Time   CALCIUM 9.4 07/17/2016 1252   CALCIUM 9.6 07/09/2016 1229   ALKPHOS 46 07/09/2016 1229   AST 17 07/09/2016 1229   ALT 19 07/09/2016 1229   BILITOT 0.42 07/09/2016 1229       Lab Results  Component Value Date   WBC 6.7 07/17/2016   HGB 13.7 07/17/2016   HCT 39.3 07/17/2016   MCV 92.9 07/17/2016   PLT 227 07/17/2016   NEUTROABS 4.2 07/09/2016    ASSESSMENT & PLAN:  Malignant neoplasm of upper-outer quadrant of left female breast ([place]) 07/23/2016: Left lumpectomy: IDC grade 1, 1 cm, with low-grade DCIS, margins negative, 0/1 lymph node negative, ER 100%, PR 100%, HER-2 negative ratio 1.58, Ki-67 10% T1 BN 0 stage IA  Pathology counseling: I discussed the final pathology report of the patient provided  a copy of this report. I discussed the margins as well as lymph node surgeries. We also discussed the final staging along with previously performed ER/PR and HER-2/neu testing.  Treatment plan: 1. Oncotype DX testing to determine if chemotherapy would be of any benefit followed by 2. Adjuvant radiation therapy followed by 3. Adjuvant antiestrogen therapyWith letrozole 2.5 mg daily  Return to clinic based upon Oncotype DX score We will see her back in 3 months.  No orders of the defined types were placed in this encounter.  The patient has a good understanding of the overall plan. she agrees with it. she will call with any problems that may develop before the next visit  here.   Rulon Eisenmenger, MD 07/30/16

## 2016-07-30 NOTE — Assessment & Plan Note (Signed)
07/23/2016: Left lumpectomy: IDC grade 1, 1 cm, with low-grade DCIS, margins negative, 0/1 lymph node negative, ER 100%, PR 100%, HER-2 negative ratio 1.58, Ki-67 10% T1 BN 0 stage IA  Pathology counseling: I discussed the final pathology report of the patient provided  a copy of this report. I discussed the margins as well as lymph node surgeries. We also discussed the final staging along with previously performed ER/PR and HER-2/neu testing.  Treatment plan: 1. Oncotype DX testing to determine if chemotherapy would be of any benefit followed by 2. Adjuvant radiation therapy followed by 3. Adjuvant antiestrogen therapy  Return to clinic based upon Oncotype DX score

## 2016-07-30 NOTE — Telephone Encounter (Signed)
Received order per Dr. Lindi Adie for oncotype testing. Requisition sent to pathology. Received by Varney Biles. PAC sent to [facility] and [place]

## 2016-08-08 ENCOUNTER — Encounter (HOSPITAL_COMMUNITY): Payer: Self-pay

## 2016-08-08 ENCOUNTER — Telehealth: Payer: Self-pay | Admitting: *Deleted

## 2016-08-08 ENCOUNTER — Other Ambulatory Visit: Payer: Self-pay | Admitting: General Surgery

## 2016-08-08 NOTE — Telephone Encounter (Signed)
Received oncotype results of 14.  Spoke with patient to inform her of results.  Referral placed for Dr. Sondra Come.  Encouraged her to call with any needs or concerns.

## 2016-08-08 NOTE — Telephone Encounter (Signed)
Received Oncotype Dx results of 14/9%.  Doris Bray a copy, placed a copy on Dr. Geralyn Flash desk and took a copy to HIM to scan.

## 2016-08-12 ENCOUNTER — Encounter: Payer: BLUE CROSS/BLUE SHIELD | Admitting: Genetic Counselor

## 2016-08-12 ENCOUNTER — Ambulatory Visit (HOSPITAL_BASED_OUTPATIENT_CLINIC_OR_DEPARTMENT_OTHER): Payer: BLUE CROSS/BLUE SHIELD | Admitting: Genetic Counselor

## 2016-08-12 ENCOUNTER — Other Ambulatory Visit: Payer: BLUE CROSS/BLUE SHIELD

## 2016-08-12 ENCOUNTER — Encounter: Payer: Self-pay | Admitting: Radiation Oncology

## 2016-08-12 ENCOUNTER — Ambulatory Visit (HOSPITAL_COMMUNITY)
Admission: RE | Admit: 2016-08-12 | Discharge: 2016-08-13 | Disposition: A | Discharge status: Home / Self Care | Payer: BLUE CROSS/BLUE SHIELD | Source: Ambulatory Visit | Attending: General Surgery | Admitting: General Surgery

## 2016-08-12 ENCOUNTER — Ambulatory Visit (HOSPITAL_COMMUNITY): Payer: BLUE CROSS/BLUE SHIELD | Admitting: Certified Registered Nurse Anesthetist

## 2016-08-12 ENCOUNTER — Encounter (HOSPITAL_COMMUNITY)
Admission: RE | Disposition: A | Discharge status: Home / Self Care | Payer: Self-pay | Source: Ambulatory Visit | Attending: General Surgery

## 2016-08-12 ENCOUNTER — Encounter: Payer: Self-pay | Admitting: Genetic Counselor

## 2016-08-12 ENCOUNTER — Encounter (HOSPITAL_COMMUNITY): Payer: Self-pay | Admitting: General Surgery

## 2016-08-12 DIAGNOSIS — Z803 Family history of malignant neoplasm of breast: Secondary | ICD-10-CM | POA: Diagnosis not present

## 2016-08-12 DIAGNOSIS — Z79899 Other long term (current) drug therapy: Secondary | ICD-10-CM | POA: Diagnosis not present

## 2016-08-12 DIAGNOSIS — N6489 Other specified disorders of breast: Secondary | ICD-10-CM | POA: Diagnosis present

## 2016-08-12 DIAGNOSIS — C50412 Malignant neoplasm of upper-outer quadrant of left female breast: Secondary | ICD-10-CM | POA: Diagnosis not present

## 2016-08-12 DIAGNOSIS — Z7183 Encounter for nonprocreative genetic counseling: Secondary | ICD-10-CM | POA: Diagnosis not present

## 2016-08-12 DIAGNOSIS — Z170 Estrogen receptor positive status [ER+]: Secondary | ICD-10-CM | POA: Diagnosis not present

## 2016-08-12 DIAGNOSIS — L7632 Postprocedural hematoma of skin and subcutaneous tissue following other procedure: Secondary | ICD-10-CM | POA: Insufficient documentation

## 2016-08-12 HISTORY — PX: EVACUATION BREAST HEMATOMA: SHX1537

## 2016-08-12 HISTORY — DX: Other specified disorders of breast: N64.89

## 2016-08-12 SURGERY — EVACUATION, HEMATOMA, BREAST
Anesthesia: General | Site: Breast | Laterality: Left

## 2016-08-12 MED ORDER — FENTANYL CITRATE (PF) 100 MCG/2ML IJ SOLN
INTRAMUSCULAR | Status: AC
  Administered 2016-08-12: 50 ug via INTRAVENOUS
  Filled 2016-08-12: qty 2

## 2016-08-12 MED ORDER — OXYCODONE HCL 5 MG/5ML PO SOLN: 5.0000 mg | Freq: Once | ORAL | Status: DC | PRN

## 2016-08-12 MED ORDER — MIDAZOLAM HCL 2 MG/2ML IJ SOLN
INTRAMUSCULAR | Status: AC
  Filled 2016-08-12: qty 2

## 2016-08-12 MED ORDER — ZOLPIDEM TARTRATE 5 MG PO TABS
5.0000 mg | ORAL_TABLET | Freq: Every evening | ORAL | Status: DC | PRN
  Administered 2016-08-12: 5 mg via ORAL
  Filled 2016-08-12: qty 1

## 2016-08-12 MED ORDER — AMPHETAMINE-DEXTROAMPHET ER 20 MG PO CP24
20.0000 mg | ORAL_CAPSULE | Freq: Every day | ORAL | Status: DC | PRN
  Filled 2016-08-12: qty 1

## 2016-08-12 MED ORDER — OXYCODONE HCL 5 MG PO TABS
ORAL_TABLET | ORAL | Status: AC
  Administered 2016-08-12: 10 mg via ORAL
  Filled 2016-08-12: qty 2

## 2016-08-12 MED ORDER — SODIUM CHLORIDE 0.9 % IR SOLN
Status: DC | PRN
  Administered 2016-08-12: 3000 mL

## 2016-08-12 MED ORDER — HYDROCODONE-ACETAMINOPHEN 5-325 MG PO TABS
1.0000 | ORAL_TABLET | Freq: Four times a day (QID) | ORAL | Status: DC | PRN
  Administered 2016-08-12: 1 via ORAL
  Administered 2016-08-13 (×2): 2 via ORAL
  Filled 2016-08-12 (×3): qty 2

## 2016-08-12 MED ORDER — ONDANSETRON HCL 4 MG/2ML IJ SOLN: 4.0000 mg | Freq: Four times a day (QID) | INTRAMUSCULAR | Status: DC | PRN

## 2016-08-12 MED ORDER — HYDROMORPHONE HCL 1 MG/ML IJ SOLN
1.0000 mg | INTRAMUSCULAR | Status: DC | PRN
  Administered 2016-08-12: 1 mg via INTRAVENOUS

## 2016-08-12 MED ORDER — FENTANYL CITRATE (PF) 100 MCG/2ML IJ SOLN
INTRAMUSCULAR | Status: AC
  Filled 2016-08-12: qty 2

## 2016-08-12 MED ORDER — LACTATED RINGERS IV SOLN
INTRAVENOUS | Status: DC
  Administered 2016-08-12 (×2): via INTRAVENOUS

## 2016-08-12 MED ORDER — CEFAZOLIN SODIUM-DEXTROSE 2-4 GM/100ML-% IV SOLN
2.0000 g | Freq: Three times a day (TID) | INTRAVENOUS | Status: DC
  Administered 2016-08-12 – 2016-08-13 (×2): 2 g via INTRAVENOUS
  Filled 2016-08-12 (×3): qty 100

## 2016-08-12 MED ORDER — LIDOCAINE HCL (CARDIAC) 20 MG/ML IV SOLN
INTRAVENOUS | Status: DC | PRN
  Administered 2016-08-12: 60 mg via INTRAVENOUS

## 2016-08-12 MED ORDER — MIDAZOLAM HCL 5 MG/5ML IJ SOLN
INTRAMUSCULAR | Status: DC | PRN
  Administered 2016-08-12: 2 mg via INTRAVENOUS

## 2016-08-12 MED ORDER — ONDANSETRON 4 MG PO TBDP: 4.0000 mg | ORAL_TABLET | Freq: Four times a day (QID) | ORAL | Status: DC | PRN

## 2016-08-12 MED ORDER — SENNA 8.6 MG PO TABS
1.0000 | ORAL_TABLET | Freq: Two times a day (BID) | ORAL | Status: DC
  Administered 2016-08-12: 8.6 mg via ORAL
  Filled 2016-08-12: qty 1

## 2016-08-12 MED ORDER — CHLORHEXIDINE GLUCONATE CLOTH 2 % EX PADS: 6.0000 | MEDICATED_PAD | Freq: Once | CUTANEOUS | Status: DC

## 2016-08-12 MED ORDER — PROPOFOL 10 MG/ML IV BOLUS
INTRAVENOUS | Status: DC | PRN
Start: 2016-08-12 — End: 2016-08-12
  Administered 2016-08-12: 200 mg via INTRAVENOUS

## 2016-08-12 MED ORDER — LACTATED RINGERS IV SOLN
INTRAVENOUS | Status: DC
  Administered 2016-08-12 – 2016-08-13 (×2): via INTRAVENOUS

## 2016-08-12 MED ORDER — CEFAZOLIN SODIUM-DEXTROSE 2-4 GM/100ML-% IV SOLN
2.0000 g | INTRAVENOUS | Status: AC
  Administered 2016-08-12: 2 g via INTRAVENOUS
  Filled 2016-08-12: qty 100

## 2016-08-12 MED ORDER — ONDANSETRON HCL 4 MG/2ML IJ SOLN
INTRAMUSCULAR | Status: DC | PRN
  Administered 2016-08-12: 4 mg via INTRAVENOUS

## 2016-08-12 MED ORDER — FENTANYL CITRATE (PF) 100 MCG/2ML IJ SOLN
INTRAMUSCULAR | Status: DC | PRN
  Administered 2016-08-12: 50 ug via INTRAVENOUS
  Administered 2016-08-12: 25 ug via INTRAVENOUS
  Administered 2016-08-12: 50 ug via INTRAVENOUS
  Administered 2016-08-12: 25 ug via INTRAVENOUS

## 2016-08-12 MED ORDER — OXYCODONE HCL 5 MG PO TABS
5.0000 mg | ORAL_TABLET | ORAL | Status: DC | PRN
  Administered 2016-08-12: 10 mg via ORAL

## 2016-08-12 MED ORDER — 0.9 % SODIUM CHLORIDE (POUR BTL) OPTIME
TOPICAL | Status: DC | PRN
  Administered 2016-08-12: 1000 mL

## 2016-08-12 MED ORDER — HYDROMORPHONE HCL 1 MG/ML IJ SOLN
INTRAMUSCULAR | Status: AC
  Administered 2016-08-12: 1 mg via INTRAVENOUS
  Filled 2016-08-12: qty 1

## 2016-08-12 MED ORDER — OXYCODONE HCL 5 MG PO TABS: 5.0000 mg | ORAL_TABLET | Freq: Once | ORAL | Status: DC | PRN

## 2016-08-12 MED ORDER — FENTANYL CITRATE (PF) 100 MCG/2ML IJ SOLN
25.0000 ug | INTRAMUSCULAR | Status: DC | PRN
  Administered 2016-08-12 (×2): 50 ug via INTRAVENOUS

## 2016-08-12 SURGICAL SUPPLY — 41 items
APL SKNCLS STERI-STRIP NONHPOA (GAUZE/BANDAGES/DRESSINGS)
APPLIER CLIP 11 MED OPEN (CLIP)
APR CLP MED 11 20 MLT OPN (CLIP)
BENZOIN TINCTURE PRP APPL 2/3 (GAUZE/BANDAGES/DRESSINGS) IMPLANT
BINDER BREAST LRG (GAUZE/BANDAGES/DRESSINGS) ×2 IMPLANT
BLADE HEX COATED 2.75 (ELECTRODE) ×2 IMPLANT
CANISTER SUCTION 2500CC (MISCELLANEOUS) ×2 IMPLANT
CLIP APPLIE 11 MED OPEN (CLIP) IMPLANT
DRAIN CHANNEL 19F RND (DRAIN) IMPLANT
DRAPE HALF SHEET 40X57 (DRAPES) ×2 IMPLANT
DRAPE LAPAROSCOPIC ABDOMINAL (DRAPES) IMPLANT
DRSG PAD ABDOMINAL 8X10 ST (GAUZE/BANDAGES/DRESSINGS) IMPLANT
ELECT REM PT RETURN 9FT ADLT (ELECTROSURGICAL) ×2
ELECTRODE REM PT RTRN 9FT ADLT (ELECTROSURGICAL) ×1 IMPLANT
EVACUATOR SILICONE 100CC (DRAIN) IMPLANT
GAUZE SPONGE 4X4 12PLY STRL (GAUZE/BANDAGES/DRESSINGS) IMPLANT
GAUZE XEROFORM 1X8 LF (GAUZE/BANDAGES/DRESSINGS) ×1 IMPLANT
GLOVE BIO SURGEON STRL SZ 6.5 (GLOVE) ×1 IMPLANT
GLOVE BIOGEL PI IND STRL 7.0 (GLOVE) ×1 IMPLANT
GLOVE BIOGEL PI INDICATOR 7.0 (GLOVE) ×1
GLOVE EUDERMIC 7 POWDERFREE (GLOVE) ×2 IMPLANT
GOWN STRL REUS W/TWL XL LVL3 (GOWN DISPOSABLE) ×4 IMPLANT
KIT BASIN OR (CUSTOM PROCEDURE TRAY) ×2 IMPLANT
MARKER SKIN DUAL TIP RULER LAB (MISCELLANEOUS) ×2 IMPLANT
NEEDLE HYPO 22GX1.5 SAFETY (NEEDLE) IMPLANT
NS IRRIG 1000ML POUR BTL (IV SOLUTION) ×2 IMPLANT
PACK GENERAL/GYN (CUSTOM PROCEDURE TRAY) ×2 IMPLANT
PAD ABD 8X10 STRL (GAUZE/BANDAGES/DRESSINGS) ×2 IMPLANT
SPONGE DRAIN TRACH 4X4 STRL 2S (GAUZE/BANDAGES/DRESSINGS) IMPLANT
SPONGE GAUZE 4X4 12PLY STER LF (GAUZE/BANDAGES/DRESSINGS) ×2 IMPLANT
SPONGE LAP 18X18 X RAY DECT (DISPOSABLE) ×1 IMPLANT
STAPLER VISISTAT 35W (STAPLE) ×1 IMPLANT
STRIP CLOSURE SKIN 1/2X4 (GAUZE/BANDAGES/DRESSINGS) IMPLANT
SUT ETHILON 3 0 PS 1 (SUTURE) ×1 IMPLANT
SUT ETHILON 4 0 FS 1 (SUTURE) ×2 IMPLANT
SUT ETHILON 4 0 PS 2 18 (SUTURE) ×1 IMPLANT
SUT MNCRL AB 4-0 PS2 18 (SUTURE) IMPLANT
SUT VIC AB 3-0 SH 18 (SUTURE) ×3 IMPLANT
SWAB COLLECTION DEVICE MRSA (MISCELLANEOUS) ×1 IMPLANT
SYR CONTROL 10ML LL (SYRINGE) IMPLANT
TOWEL OR 17X26 10 PK STRL BLUE (TOWEL DISPOSABLE) ×2 IMPLANT

## 2016-08-12 NOTE — Transfer of Care (Signed)
Immediate Anesthesia Transfer of Care Note  Patient: Doris Bray  Procedure(s) Performed: Procedure(s): EVACUATION HEMATOMA LEFT BREAST (Left)  Patient Location: PACU  Anesthesia Type:General  Level of Consciousness: awake, alert , oriented and patient cooperative  Airway & Oxygen Therapy: Patient Spontanous Breathing  Post-op Assessment: Report given to RN and Post -op Vital signs reviewed and stable  Post vital signs: Reviewed and stable  Last Vitals:  Vitals:   08/12/16 1506 08/12/16 1730  BP: (!) 173/88 (!) 195/80  Pulse: 83 87  Resp: 18 15  Temp: 36.7 C 36.3 C    Last Pain:  Vitals:   08/12/16 1506  TempSrc: Oral         Complications: No apparent anesthesia complications

## 2016-08-12 NOTE — Progress Notes (Signed)
Patient transferred from PACU, alert and oriented, mild pain, JP drain to left side chest area. IV fluids running. Received report via phone from PACU nurse. Patient oriented to room and staff, no complaints at this moment, no family at bedside yet. Will continue to monitor.

## 2016-08-12 NOTE — Anesthesia Preprocedure Evaluation (Signed)
Anesthesia Evaluation  Patient identified by MRN, date of birth, ID band Patient awake    Reviewed: Allergy & Precautions, NPO status , Patient's Chart, lab work & pertinent test results  Airway Mallampati: II   Neck ROM: full    Dental   Pulmonary neg pulmonary ROS,    breath sounds clear to auscultation       Cardiovascular hypertension, + Valvular Problems/Murmurs MVP  Rhythm:regular Rate:Normal     Neuro/Psych    GI/Hepatic   Endo/Other    Renal/GU      Musculoskeletal   Abdominal   Peds  Hematology   Anesthesia Other Findings   Reproductive/Obstetrics Breast CA                             Anesthesia Physical Anesthesia Plan  ASA: II  Anesthesia Plan: General   Post-op Pain Management:    Induction: Intravenous  Airway Management Planned: LMA  Additional Equipment:   Intra-op Plan:   Post-operative Plan:   Informed Consent: I have reviewed the patients History and Physical, chart, labs and discussed the procedure including the risks, benefits and alternatives for the proposed anesthesia with the patient or authorized representative who has indicated his/her understanding and acceptance.     Plan Discussed with: CRNA, Surgeon and Anesthesiologist  Anesthesia Plan Comments:         Anesthesia Quick Evaluation

## 2016-08-12 NOTE — Op Note (Signed)
Patient Name:           Doris Bray   Date of Surgery:        08/12/2016  Pre op Diagnosis:      Hematoma left breast, postop lumpectomy  Post op Diagnosis:    Same  Procedure:                 Evacuation hematoma left breast  Surgeon:                     Edsel Petrin. [name], M.D., FACS  Assistant:                      OR staff   Indication for Assistant: N/A  Operative Indications:         The patient is a 56 year old female who presents with breast cancer. This is a 56 year old female who returns for a postop wound check. She says the hematoma in her left breast is more firm, more tense, and a little bit painful. There is not been any drainage.      On July 23, 2016 she underwent left breast lumpectomy and left axillary sentinel node biopsy. She had a 1.0 cm diameter invasive duct carcinoma, receptor positive, HER-2 negative, negative margins. Solitary sentinel node was negative. She has been told by Dr. Lindi Adie that she should proceed with radiation therapy and he will follow up with her in a couple of months. She does not have an appointment with Dr. Sondra Come but that is being arranged.      I saw her two days postop and she had a soft but noticeable hematoma of the left breast. No axillary swelling. No arm swelling. She wasn't having any discomfort and so we elected to do nothing about this in hopes that it would resolve with medical management. She returns today at her scheduled appointment and says there is a firmer and more painful although not severe. She has been socially active. I gave her a copy of her pathology report After examining her I offered to evacuate the hematoma in the operating room. I do think that she will recover quicker if we do this although it will keep her out of work for a few days and she may need a drain. After talking this over for about 15 or 20 minutes she agrees with this. We'll set this surgery up for early next week as an urgent  add-on.. I discussed the indications, techniques, and risk of the surgery in detail. In the meantime she'll wear a sports bra and call me if is any drainage. This will probably postpone the initiation of radiation therapy for 3 or 4 weeks.  Operative Findings:       There was a large hematoma in the left breast, the size of the small grapefruit.  42 old, soft clotted blood.  No sign of infection but cultures were taken.  I felt that the fluid would rapidly reaccumulate and so I left a drain temporarily  Procedure in Detail:          Following the induction of general anesthesia the patient's left chest wall and breasts were prepped and draped in a sterile fashion.  Surgical timeout was performed.  Intravenous antibiotics were given.  With a small my 60 the old lumpectomy incision in the left breast upper outer quadrant and with suction and manual evacuation evacuated a large old hematoma.  I then irrigated the wound  manually.  I then used the pulsatile irrigator and cleaned out the entire cavity.  There was no specific arterial bleeder seen.  The surface of the lumpectomy cavity was raw and oozing and so I spent some time cauterizing the surface of the lumpectomy cavity.  I placed a 64 [place] Blake drain into the lumpectomy cavity and brought it out through a small incision in the inframammary crease laterally.  This was sutured to the scan echo next a suction bulb.  The skin was closed with interrupted 4-0 nylon sutures.  The suction drain held the charge very nicely.  The wound was cleansed and dried and covered with Xeroform, 4 x 4's, ABDs, and a breast binder.  Patient tolerated procedure well was taken to PACU in stable condition.  EBL 400-500 cc of old blood.  20 mL new bleeding.  Sponge, needle and instrument counts  were correct.  Complications none.     Edsel Petrin. [name], M.D., FACS General and Minimally Invasive Surgery Breast and Colorectal Surgery  08/12/2016 5:25 PM

## 2016-08-12 NOTE — Interval H&P Note (Signed)
History and Physical Interval Note:  08/12/2016 4:05 PM  Doris Bray  has presented today for surgery, with the diagnosis of Hematoma left breast  The various methods of treatment have been discussed with the patient and family. After consideration of risks, benefits and other options for treatment, the patient has consented to  Procedure(s): EVACUATION HEMATOMA LEFT BREAST (Left) as a surgical intervention .  The patient's history has been reviewed, patient examined, no change in status, stable for surgery.  I have reviewed the patient's chart and labs.  Questions were answered to the patient's satisfaction.     Adin Hector

## 2016-08-12 NOTE — H&P (Signed)
Bobby Rumpf Location: [facility] Surgery Patient #: [id number] DOB: [birth year] Married / Language: English / Race: White Female        History of Present Illness   The patient is a 56 year old female who presents with breast cancer. This is a 56 year old female who returns for a postop wound check. She says the hematoma in her left breast is more firm, more tense, and a little bit painful. There is not been any drainage.      On July 23, 2016 she underwent left breast lumpectomy and left axillary sentinel node biopsy. She had a 1.0 cm diameter invasive duct carcinoma, receptor positive, HER-2 negative, negative margins. Solitary sentinel node was negative. She has been told by Dr. Lindi Adie that she should proceed with radiation therapy and he will follow up with her in a couple of months. She does not have an appointment with Dr. Sondra Come but that is being arranged.      I saw her two days postop and she had a soft but noticeable hematoma of the left breast. No axillary swelling. No arm swelling. She wasn't having any discomfort and so we elected to do nothing about this in hopes that it would resolve with medical management. She returns today at her scheduled appointment and says there is a firmer and more painful although not severe. She has been socially active. I gave her a copy of her pathology report After examining her I offered to evacuate the hematoma in the operating room. I do think that she will recover quicker if we do this although it will keep her out of work for a few days and she may need a drain. After talking this over for about 15 or 20 minutes she agrees with this. We'll set this surgery up for early next week as an urgent add-on.. I discussed the indications, techniques, and risk of the surgery in detail. In the meantime she'll wear a sports bra and call me if is any drainage. This will probably postpone the initiation of radiation therapy for  3 or 4 weeks.   Allergies  No Known Drug Allergies  Medication History  Adderall XR ('20MG'$  Capsule ER 24HR, Oral as needed) Active. Medications Reconciled  Vitals  Weight: 186 lb Height: 69in Body Surface Area: 2 m Body Mass Index: 27.47 kg/m  Temp.: 75F  Pulse: 82 (Regular)  BP: 138/80 (Sitting, Left Arm, Standard)     Physical Exam  General Mental Status-Alert. General Appearance-Not in acute distress. Build & Nutrition-Well nourished. Posture-Normal posture. Gait-Normal.  Head and Neck Head-normocephalic, atraumatic with no lesions or palpable masses. Trachea-midline. Thyroid Gland Characteristics - normal size and consistency and no palpable nodules.  Chest and Lung Exam Chest and lung exam reveals -on auscultation, normal breath sounds, no adventitious sounds and normal vocal resonance.  Breast Note: Left breast is swollen and firm and discolored from an organized hematoma. The hematoma is probably 12-15 cm diameter. There is no sign of infection or drainage but this is much more tense and a little bit tender. Significant change from her last office visit. The axilla and the axillary incision feel fine. No left arm swelling. Right breast is not examined.   Cardiovascular Cardiovascular examination reveals -normal heart sounds, regular rate and rhythm with no murmurs and femoral artery auscultation bilaterally reveals normal pulses, no bruits, no thrills.  Abdomen Inspection Inspection of the abdomen reveals - No Hernias. Palpation/Percussion Palpation and Percussion of the abdomen reveal - Soft, Non Tender,  No Rigidity (guarding), No hepatosplenomegaly and No Palpable abdominal masses.  Neurologic Neurologic evaluation reveals -alert and oriented x 3 with no impairment of recent or remote memory, normal attention span and ability to concentrate, normal sensation and normal coordination.  Musculoskeletal Normal Exam -  Bilateral-Upper Extremity Strength Normal and Lower Extremity Strength Normal.    Assessment & Plan BREAST HEMATOMA (N64.89)   The hematoma in your left breast has become somewhat larger, is now more tense and is somewhat painful I think that you will recover more quickly if we take you to the operating room next week and evacuate the hematoma. We may or may not leave a drain We will probably need to keep you out of work for a few days after this This may postpone radiation therapy for about a month, but in the end will allow you to recover more quickly We have discussed the indications, techniques, and risks of the surgery  I'm going to try to add this on to the operating room schedule on Tuesday, January 2  PRIMARY CANCER OF UPPER OUTER QUADRANT OF LEFT FEMALE BREAST (C50.412) HISTORY OF 2 CESAREAN SECTIONS (Z87.59)   Edsel Petrin. [name], M.D., [facility] General and Minimally invasive Surgery Breast and Colorectal Surgery Office:   [phone] Pager:   [phone]

## 2016-08-12 NOTE — Progress Notes (Signed)
REFERRING PROVIDER: [name], MD [place], [place] [address]   [name], MD  PRIMARY PROVIDER:  Gennette Pac, MD  PRIMARY REASON FOR VISIT:  1. Malignant neoplasm of upper-outer quadrant of left breast in female, estrogen receptor positive ([place])   2. Family history of breast cancer      HISTORY OF PRESENT ILLNESS:   Ms. Geraghty, a 56 y.o. female, was seen for a [place] cancer genetics consultation at the request of Dr. Rex Kras due to a personal and family history of cancer.  Ms. Norgard presents to clinic today to discuss the possibility of a hereditary predisposition to cancer, genetic testing, and to further clarify her future cancer risks, as well as potential cancer risks for family members.   In November 2017, at the age of 58, Ms. Pho was diagnosed with invasive ductal carcinoma of the left breast. The tumor is ER+/PR+/Her2-. This was treated with lumpectomy.  Radiation is scheduled.    CANCER HISTORY:    Malignant neoplasm of upper-outer quadrant of left female breast ([place])   07/01/2016 Initial Diagnosis    Left breast biopsy 2:00 position: IDC grade 1 with DCIS, ER 100%, PR 100%, Ki-67 10%, HER-2 negative ratio 1.58; mammogram revealed upper-outer quadrant nodule in the left breast 1 x 0.8 x 0.5 cm at 2:00 axilla negative, T1 BN 0 stage IA      07/23/2016 Surgery    Left lumpectomy: IDC grade 1, 1 cm, with low-grade DCIS, margins negative, 0/1 lymph node negative, ER 100%, PR 100%, HER-2 negative ratio 1.58, Ki-67 10% T1 BN 0 stage IA         HORMONAL RISK FACTORS:  Menarche was at age 76.  First live birth at age 5.  OCP use for approximately 7 years.  Ovaries intact: yes.  Hysterectomy: no.  Menopausal status: postmenopausal.  HRT use: 3 years. Colonoscopy: yes; normal. Mammogram within the last year: yes. Number of breast biopsies: 1. Up to date with pelvic exams:  yes. Any excessive radiation exposure in the past:   no  Past Medical History:  Diagnosis Date  . Bigeminy   . Family history of breast cancer   . Malignant neoplasm of upper-outer quadrant of left female breast ([place]) 07/02/2016  . Ventricular tachycardia [facility])     Past Surgical History:  Procedure Laterality Date  . BREAST LUMPECTOMY WITH RADIOACTIVE SEED AND SENTINEL LYMPH NODE BIOPSY Left 07/23/2016   Procedure: LEFT BREAST LUMPECTOMY WITH RADIOACTIVE SEED AND SENTINEL LYMPH NODE BIOPSY, INJECT BLUE DYE LEFT BREAST;  Surgeon: [name], MD;  Location: [place];  Service: General;  Laterality: Left;  . CESAREAN SECTION     x2  . COLONOSCOPY      Social History   Social History  . Marital status: Divorced    Spouse name: N/A  . Number of children: 2  . Years of education: N/A   Social History Main Topics  . Smoking status: Never Smoker  . Smokeless tobacco: Never Used  . Alcohol use 0.0 oz/week     Comment: 4 week  . Drug use: No  . Sexual activity: Not Asked   Other Topics Concern  . None   Social History Narrative  . None     FAMILY HISTORY:  We obtained a detailed, 4-generation family history.  Significant diagnoses are listed below: Family History  Problem Relation Age of Onset  . Breast cancer Mother 53    The patient has two children who are cancer  free.  She has a paternal half sister who is cancer free.  The patient's parents are both deceased. Her mother had breast cancer at 55 and died at 23 from dementia complications.  Her mother had nine siblings who are cancer free.  There is no other cancer reported on the maternal side of the family.  The patient's father died at 65 from a stroke.  He had six siblings, none of whom had cancer.  There is no other reported cancer history on this side of the family.  Ms. Paradiso is unaware of previous family history of genetic testing for hereditary cancer risks. Patient's maternal ancestors are of [place] and [place] descent, and paternal ancestors are of [place]  descent. There is no reported Ashkenazi Jewish ancestry. There is no known consanguinity.  GENETIC COUNSELING ASSESSMENT: CARYLON TAMBURRO is a 56 y.o. female with a personal and family history of breast cancer which is somewhat suggestive of a hereditary cancer syndrome and predisposition to cancer. We, therefore, discussed and recommended the following at today's visit.   DISCUSSION: WE discussed that about 5-10% of breast cancer is hereditary with most cases due to BRCA mutations.  Other genes associated with moderate risk hereditary breast cancer include ATM, CHEK2 and PALB2.  We reviewed the characteristics, features and inheritance patterns of hereditary cancer syndromes. We also discussed genetic testing, including the appropriate family members to test, the process of testing, insurance coverage and turn-around-time for results. We discussed the implications of a negative, positive and/or variant of uncertain significant result. We recommended Ms. Turnbaugh pursue genetic testing for the Breast/GYN gene panel. The Breast/GYN gene panel offered by [facility] includes sequencing and rearrangement analysis for the following 23 genes:  ATM, BARD1, BRCA1, BRCA2, BRIP1, CDH1, CHEK2, EPCAM, FANCC, MLH1, MSH2, MSH6, MUTYH, NBN, NF1, PALB2, PMS2, POLD1, PTEN, RAD51C, RAD51D, RECQL, and TP53.     Based on Ms. Blane personal and family history of cancer, she meets medical criteria for genetic testing. Despite that she meets criteria, she may still have an out of pocket cost. We discussed that if her out of pocket cost for testing is over $100, the laboratory will call and confirm whether she wants to proceed with testing.  If the out of pocket cost of testing is less than $100 she will be billed by the genetic testing laboratory.   PLAN: After considering the risks, benefits, and limitations, Ms. Klimaszewski  provided informed consent to pursue genetic testing and the blood sample was sent to [facility] for  analysis of the Breast/GYN. Results should be available within approximately 2-3 weeks' time, at which point they will be disclosed by telephone to Ms. Folz, as will any additional recommendations warranted by these results. Ms. Aguiniga will receive a summary of her genetic counseling visit and a copy of her results once available. This information will also be available in Epic. We encouraged Ms. Magno to remain in contact with cancer genetics annually so that we can continuously update the family history and inform her of any changes in cancer genetics and testing that may be of benefit for her family. Ms. Dumler questions were answered to her satisfaction today. Our contact information was provided should additional questions or concerns arise.  Lastly, we encouraged Ms. Padron to remain in contact with cancer genetics annually so that we can continuously update the family history and inform her of any changes in cancer genetics and testing that may be of benefit for this family.   Ms.  Digangi's questions were answered to her satisfaction today. Our contact information was provided should additional questions or concerns arise. Thank you for the referral and allowing [place] to share in the care of your patient.   Wyn Nettle P. Florene Glen, [place], [facility] Certified Genetic Counselor [email or web address] phone: [phone]  The patient was seen for a total of 45 minutes in face-to-face genetic counseling.  This patient was discussed with Drs. Magrinat, Lindi Adie and/or Burr Medico who agrees with the above.    _______________________________________________________________________ For Office Staff:  Number of people involved in session: 2 Was an Intern/ student involved with case: no

## 2016-08-12 NOTE — Anesthesia Procedure Notes (Signed)
Procedure Name: LMA Insertion Performed by: Valda Favia Pre-anesthesia Checklist: Patient identified, Emergency Drugs available, Suction available, Patient being monitored and Timeout performed Patient Re-evaluated:Patient Re-evaluated prior to inductionOxygen Delivery Method: Circle system utilized Preoxygenation: Pre-oxygenation with 100% oxygen Intubation Type: IV induction LMA: LMA inserted LMA Size: 5.0 Placement Confirmation: positive ETCO2 and breath sounds checked- equal and bilateral Tube secured with: Tape Dental Injury: Teeth and Oropharynx as per pre-operative assessment

## 2016-08-13 ENCOUNTER — Encounter (HOSPITAL_COMMUNITY): Payer: Self-pay | Admitting: General Surgery

## 2016-08-13 DIAGNOSIS — L7632 Postprocedural hematoma of skin and subcutaneous tissue following other procedure: Secondary | ICD-10-CM | POA: Diagnosis not present

## 2016-08-13 LAB — PROTIME-INR
INR: 1.03
PROTHROMBIN TIME: 13.6 s (ref 11.4–15.2)

## 2016-08-13 LAB — CBC
HCT: 34.2 % — ABNORMAL LOW (ref 36.0–46.0)
Hemoglobin: 11.4 g/dL — ABNORMAL LOW (ref 12.0–15.0)
MCH: 31.8 pg (ref 26.0–34.0)
MCHC: 33.3 g/dL (ref 30.0–36.0)
MCV: 95.5 fL (ref 78.0–100.0)
PLATELETS: 196 10*3/uL (ref 150–400)
RBC: 3.58 MIL/uL — ABNORMAL LOW (ref 3.87–5.11)
RDW: 13.2 % (ref 11.5–15.5)
WBC: 4.2 10*3/uL (ref 4.0–10.5)

## 2016-08-13 LAB — APTT: APTT: 26 s (ref 24–36)

## 2016-08-13 NOTE — Anesthesia Postprocedure Evaluation (Signed)
Anesthesia Post Note  Patient: Doris Bray  Procedure(s) Performed: Procedure(s) (LRB): EVACUATION HEMATOMA LEFT BREAST (Left)  Patient location during evaluation: PACU Anesthesia Type: General Level of consciousness: awake and alert and patient cooperative Pain management: pain level controlled Vital Signs Assessment: post-procedure vital signs reviewed and stable Respiratory status: spontaneous breathing and respiratory function stable Cardiovascular status: stable Anesthetic complications: no       Last Vitals:  Vitals:   08/13/16 0141 08/13/16 0536  BP: (!) 171/61 (!) 168/67  Pulse: 65 69  Resp:  18  Temp: 36.6 C 36.8 C    Last Pain:  Vitals:   08/13/16 0909  TempSrc:   PainSc: [name]

## 2016-08-13 NOTE — Progress Notes (Signed)
Bobby Rumpf to be D/C'd  per MD order. Discussed with the patient and all questions fully answered.  VSS, Skin clean, dry and intact without evidence of skin break down, no evidence of skin tears noted.  IV catheter discontinued intact. Site without signs and symptoms of complications. Dressing and pressure applied.  An After Visit Summary was printed and given to the patient. Patient received prescription.  D/c education completed with patient/family including follow up instructions, medication list, d/c activities limitations if indicated, with other d/c instructions as indicated by MD - patient able to verbalize understanding, all questions fully answered.   Patient instructed to return to ED, call 911, or call MD for any changes in condition.   Patient declined WC transport, and D/C home via private auto.

## 2016-08-13 NOTE — Discharge Summary (Signed)
Patient ID: Doris Bray [id number] 56 y.o. Nov 08, 1960  Admit date: 08/12/2016  Discharge date and time: 08/14/2015  Admitting Physician: Adin Hector  Discharge Physician: Adin Hector  Admission Diagnoses: Hematoma left breast  Discharge Diagnoses: Hematoma left breast                                         Primary cancer upper outer quadrant left female breast                                         History of C-section  Operations: Procedure(s): EVACUATION HEMATOMA LEFT BREAST  Admission Condition: good  Discharged Condition: good  Indication for Admission:  This is a 56 year old female who returns for a postop wound check. She says the hematoma in her left breast is more firm, more tense, and a little bit painful. There is not been any drainage. On July 23, 2016 she underwent left breast lumpectomy and left axillary sentinel node biopsy. She had a 1.0 cm diameter invasive duct carcinoma, receptor positive, HER-2 negative, negative margins. Solitary sentinel node was negative. She has been told by Dr. Lindi Adie that she should proceed with radiation therapy and he will follow up with her in a couple of months. She does not have an appointment with Dr. Sondra Come but that is being arranged. I saw her two days postop and she had a soft but noticeable hematoma of the left breast. No axillary swelling. No arm swelling. She wasn't having any discomfort and so we elected to do nothing about this in hopes that it would resolve with medical management. She returns today at her scheduled appointment and says there is a firmer and more painful although not severe. She has been socially active. I gave her a copy of her pathology report After examining her I offered to evacuate the hematoma in the operating room. I do think that she will recover quicker if we do this although it will keep her out of work for a few days and she may need a drain. After  talking this over for about 15 or 20 minutes she agrees with this. We'll set this surgery up for early next week as an urgent add-on.. I discussed the indications, techniques, and risk of the surgery in detail. In the meantime she'll wear a sports bra and call me if is any drainage. This will probably postpone the initiation of radiation therapy for 3 or 4 weeks.  Hospital Course: On the day of admission she was taken to the operating room and underwent evacuation of the hematoma of her left breast.  The surgery was uneventful.  The hematoma was large but uncomplicated.  The wound was closed and a drain was left.  She was observed overnight.  She did very well and was asking to go home on postop day 1.  She said that her pain was much better.  Examination of the wound showed some volume loss, as expected, in the region of the hematoma.  The breast is still cannot ecchymotic and a little bit firm.  The drainage is serosanguineous and there is no sign of active bleeding.     Lab work was drawn this morning and is pending.     Cultures were obtained in the operating room but I expect  these will be negative.     She has hydrocodone at home     Diet and activities were discussed.     I discussed the short-term and long-term natural history of healing of the wound such as this.     Hopefully she can get started on her radiation therapy in about one month.     Wound care and drain care was discussed in detail.  Consults: None  Significant Diagnostic Studies: labs:   Treatments: surgery: Evacuation left breast hematoma  Disposition: Home  Patient Instructions:  Allergies as of 08/13/2016      Reactions   No Known Allergies       Medication List    STOP taking these medications   ADVIL PM PO     TAKE these medications   amphetamine-dextroamphetamine 20 MG 24 hr capsule Commonly known as:  ADDERALL XR Take 20 mg by mouth daily as needed (attention).   HYDROcodone-acetaminophen 5-325 MG  tablet Commonly known as:  NORCO Take 1-2 tablets by mouth every 6 (six) hours as needed for moderate pain or severe pain.       Activity: activity as tolerated Diet: regular diet Wound Care: as directed  Follow-up:  With Dr. [name] in 5-6 days.  Signed: Edsel Petrin. [name], M.D., FACS General and minimally invasive surgery Breast and Colorectal Surgery  08/13/2016, 6:12 AM

## 2016-08-13 NOTE — Discharge Instructions (Signed)
-  see above 

## 2016-08-17 LAB — AEROBIC/ANAEROBIC CULTURE (SURGICAL/DEEP WOUND)

## 2016-08-17 LAB — AEROBIC/ANAEROBIC CULTURE W GRAM STAIN (SURGICAL/DEEP WOUND)

## 2016-08-18 ENCOUNTER — Ambulatory Visit
Admission: RE | Admit: 2016-08-18 | Discharge: 2016-08-18 | Disposition: A | Discharge status: Home / Self Care | Payer: BLUE CROSS/BLUE SHIELD | Source: Ambulatory Visit | Attending: Radiation Oncology | Admitting: Radiation Oncology

## 2016-08-18 ENCOUNTER — Encounter: Payer: Self-pay | Admitting: Radiation Oncology

## 2016-08-18 DIAGNOSIS — Z510 Encounter for antineoplastic radiation therapy: Secondary | ICD-10-CM | POA: Diagnosis not present

## 2016-08-18 DIAGNOSIS — Z170 Estrogen receptor positive status [ER+]: Secondary | ICD-10-CM

## 2016-08-18 DIAGNOSIS — C50412 Malignant neoplasm of upper-outer quadrant of left female breast: Secondary | ICD-10-CM | POA: Diagnosis present

## 2016-08-18 NOTE — Progress Notes (Signed)
Location of Breast Cancer: upper-outer quadrant of left breast  Histology per Pathology Report:   07/23/16 Diagnosis 1. Breast, lumpectomy, Left - INVASIVE DUCTAL CARCINOMA WITH CALCIFICATIONS, GRADE I/III, SPANNING 1.0 CM. - DUCTAL CARCINOMA IN SITU, LOW GRADE - THE SURGICAL RESECTION MARGINS ARE NEGATIVE FOR CARCINOMA. - SEE ONCOLOGY TABLE BELOW. 2. Lymph node, sentinel, biopsy, Left axillary - THERE IS NO EVIDENCE OF CARCINOMA IN 1 OF 1 LYMPH NODE (0/1).  07/01/16 Diagnosis Breast, left, needle core biopsy, 2 o'clock - INVASIVE DUCTAL CARCINOMA, GRADE 1. - DUCTAL CARCINOMA IN SITU.  Receptor Status: ER(100%), PR (100%), Her2-neu (neg), Ki-(10%)  Did patient present with symptoms (if so, please note symptoms) or was this found on screening mammography?: screening mammogram  Past/Anticipated interventions by surgeon, if any: 07/23/16 - Procedure: LEFT BREAST LUMPECTOMY WITH RADIOACTIVE SEED AND SENTINEL LYMPH NODE BIOPSY, INJECT BLUE DYE LEFT BREAST;  Surgeon: [name], MD;  Past/Anticipated interventions by medical oncology, if any: Adjuvant radiation therapy followed by adjuvant antiestrogen therapyWith letrozole 2.5 mg daily.  Lymphedema issues, if any:  no    Pain issues, if any:  no   SAFETY ISSUES:  Prior radiation? no  Pacemaker/ICD? no  Possible current pregnancy?no  Is the patient on methotrexate? no  Current Complaints / other details:  Patient had surgery on 08/13/16 - EVACUATION HEMATOMA LEFT BREAST by Dr. [name].  She had the drain removed today and will see Dr. [name] on Monday.  BP (!) 157/87 (BP Location: Right Arm, Patient Position: Sitting)   Pulse 74   Temp 97.7 F (36.5 C) (Oral)   Ht _0  (1.753 m)   Wt 188 lb 3.2 oz (85.4 kg)   SpO2 100%   BMI 27.79 kg/m    Wt Readings from Last 3 Encounters:  08/18/16 188 lb 3.2 oz (85.4 kg)  08/12/16 185 lb (83.9 kg)  07/30/16 185 lb (83.9 kg)      Jacqulyn Liner, RN 08/18/2016,11:15 AM

## 2016-08-18 NOTE — Progress Notes (Signed)
Please see the Nurse Progress Note in the MD Initial Consult Encounter for this patient. 

## 2016-08-18 NOTE — Progress Notes (Signed)
Radiation Oncology         [id number]) [phone] ________________________________  Name: Doris Bray MRN: [id number]  Date: 08/18/2016  DOB: [birth year]  Re-evaluation Note  CC: Mickie Hillier, MD  Serena Croissant, MD   Diagnosis:  Clinical stage (T1b, Nx, Mx) grade 1 IDC and DCIS of the left breast (ER/PR+, HER2-)  Narrative:  The patient returns today for re-evaluation. Patient presented to breast clinic on 07/09/16. Since clinic, patient underwent left lumpectomy on 07/23/16. This showed invasive ductal carcinoma with calcifications, grade I/III, spanning 1.0 cm. Lumpectomy also showed DCIS, low grade. The margins were negative for carcinoma. Sentinel lymph node biopsy revealed no evidence of carcinoma in one lymph node (0/1). Patient then underwent evacuation hematoma left breast on 08/13/16 by Dr. Derrell Lolling. She had the drain removed today and is scheduled to follow-up with Dr. Derrell Lolling on Monday.  Patient denies lymphedema issues or pain at this time. Patient notes increased hot flashes since surgery that affect her sleep. She was on hormone replacement prior to her diagnosis.  Oncotype DX testing shows a low risk for systemic recurrence and the patient would not benefit from adjuvant chemotherapy.  Of note, patient is going on a trip from February 8-14. She wishes to proceed with treatment after the trip.                   ALLERGIES:  is allergic to no known allergies.  Meds: Current Outpatient Prescriptions  Medication Sig Dispense Refill  . amphetamine-dextroamphetamine (ADDERALL XR) 20 MG 24 hr capsule Take 20 mg by mouth daily as needed (attention).    Marland Kitchen HYDROcodone-acetaminophen (NORCO) 5-325 MG tablet Take 1-2 tablets by mouth every 6 (six) hours as needed for moderate pain or severe pain. (Patient not taking: Reported on 08/18/2016) 30 tablet 0   No current facility-administered medications for this encounter.     Physical Findings: The patient is in no acute distress. Patient  is alert and oriented.  height is 5\' 9"  (1.753 m) and weight is 188 lb 3.2 oz (85.4 kg). Her oral temperature is 97.7 F (36.5 C). Her blood pressure is 157/87 (abnormal) and her pulse is 74. Her oxygen saturation is 100%. .  No significant changes. Lungs are clear to auscultation bilaterally. Heart has regular rate and rhythm. No palpable cervical, supraclavicular, or axillary adenopathy. Abdomen soft, non-tender, normal bowel sounds. Bandage on the underside of the left breast. There is a surgical scar with sutures in place in UOQ and separate scar in the axillary region from sentinel lymph node procedure. There is discoloration of the breast consistent with prior hematoma.  Lab Findings: Lab Results  Component Value Date   WBC 4.2 08/13/2016   HGB 11.4 (L) 08/13/2016   HCT 34.2 (L) 08/13/2016   MCV 95.5 08/13/2016   PLT 196 08/13/2016    Radiographic Findings: Mm Breast Surgical Specimen  Result Date: 07/23/2016 CLINICAL DATA:  Left lumpectomy for invasive ductal carcinoma and ductal carcinoma in situ. EXAM: SPECIMEN RADIOGRAPH OF THE LEFT BREAST COMPARISON:  Previous exam(s). FINDINGS: Status post excision of the left breast. The radioactive seed and biopsy marker clip are present, completely intact, and were marked for pathology. IMPRESSION: Specimen radiograph of the left breast. Electronically Signed   By: 07/25/2016 M.D.   On: 07/23/2016 14:27    Impression: Clinical stage 1 (pT1b, pN0) grade 1 IDC and DCIS of the left breast (ER/PR+, HER2-). Patient would be a good candidate for breast conservation therapy with radiation therapy  directed to the left breast. I discussed the course of treatment, side effects, and potential toxicities with the patient. She appears to understand and wishes to proceed with treatment. A consent form was signed and a copy was placed in the patient's chart.           Plan: Simulation and treatment planning will be scheduled for 6 weeks after her second  surgery.  Anticipate approximately 6-1/2 weeks of radiation therapy but will attempt hypo-fractionation if technically possible.                          ____________________________________   This document serves as a record of services personally performed by [name], MD. It was created on his behalf by Bethann Humble, a trained medical scribe. The creation of this record is based on the scribe's personal observations and the provider's statements to them. This document has been checked and approved by the attending provider.

## 2016-08-26 ENCOUNTER — Encounter: Payer: Self-pay | Admitting: Genetic Counselor

## 2016-08-26 DIAGNOSIS — Z1379 Encounter for other screening for genetic and chromosomal anomalies: Secondary | ICD-10-CM | POA: Insufficient documentation

## 2016-09-03 ENCOUNTER — Telehealth: Payer: Self-pay | Admitting: Hematology and Oncology

## 2016-09-03 NOTE — Telephone Encounter (Signed)
FAXED RECORDS TO [facility] [phone]

## 2016-09-08 ENCOUNTER — Telehealth: Payer: Self-pay | Admitting: Hematology and Oncology

## 2016-09-08 NOTE — Telephone Encounter (Signed)
Faxed office notes to [facility]

## 2016-09-09 ENCOUNTER — Ambulatory Visit: Payer: Self-pay | Admitting: Genetic Counselor

## 2016-09-09 DIAGNOSIS — Z1379 Encounter for other screening for genetic and chromosomal anomalies: Secondary | ICD-10-CM

## 2016-09-09 DIAGNOSIS — Z803 Family history of malignant neoplasm of breast: Secondary | ICD-10-CM

## 2016-09-09 NOTE — Progress Notes (Signed)
HPI: Ms. Uppal was previously seen in the [facility] due to a personal and family history of cancer and concerns regarding a hereditary predisposition to cancer. Please refer to our prior cancer genetics clinic note for more information regarding Ms. Wendland medical, social and family histories, and our assessment and recommendations, at the time. Ms. Jenkin recent genetic test results were disclosed to her, as were recommendations warranted by these results. These results and recommendations are discussed in more detail below.  CANCER HISTORY:    Malignant neoplasm of upper-outer quadrant of left female breast ([place])   07/01/2016 Initial Diagnosis    Left breast biopsy 2:00 position: IDC grade 1 with DCIS, ER 100%, PR 100%, Ki-67 10%, HER-2 negative ratio 1.58; mammogram revealed upper-outer quadrant nodule in the left breast 1 x 0.8 x 0.5 cm at 2:00 axilla negative, T1 BN 0 stage IA      07/23/2016 Surgery    Left lumpectomy: IDC grade 1, 1 cm, with low-grade DCIS, margins negative, 0/1 lymph node negative, ER 100%, PR 100%, HER-2 negative ratio 1.58, Ki-67 10% T1 BN 0 stage IA       08/22/2016 Genetic Testing    Patient has genetic testing done for personal and family history of breast cancer. BARD1 c.1409A>G and MUTYH c.1276C>G VUS found on the Breast/GYN panel.  The Breast/GYN gene panel offered by [facility] includes sequencing and rearrangement analysis for the following 23 genes:  ATM, BARD1, BRCA1, BRCA2, BRIP1, CDH1, CHEK2, EPCAM, FANCC, MLH1, MSH2, MSH6, MUTYH, NBN, NF1, PALB2, PMS2, POLD1, PTEN, RAD51C, RAD51D, RECQL, and TP53.          FAMILY HISTORY:  We obtained a detailed, 4-generation family history.  Significant diagnoses are listed below: Family History  Problem Relation Age of Onset  . Breast cancer Mother 21    The patient has two children who are cancer free.  She has a paternal half sister who is cancer free.  The patient's parents are both  deceased. Her mother had breast cancer at 44 and died at 30 from dementia complications.  Her mother had nine siblings who are cancer free.  There is no other cancer reported on the maternal side of the family.  The patient's father died at 13 from a stroke.  He had six siblings, none of whom had cancer.  There is no other reported cancer history on this side of the family.  Ms. Barbe is unaware of previous family history of genetic testing for hereditary cancer risks. Patient's maternal ancestors are of [place] and [place] descent, and paternal ancestors are of [place] descent. There is no reported Ashkenazi Jewish ancestry. There is no known consanguinity.  GENETIC TEST RESULTS: Genetic testing reported out on August 22, 2016 through the [facility] [place] cancer panel found no deleterious mutations.  The Breast/GYN gene panel offered by [facility] includes sequencing and rearrangement analysis for the following 23 genes:  ATM, BARD1, BRCA1, BRCA2, BRIP1, CDH1, CHEK2, EPCAM, FANCC, MLH1, MSH2, MSH6, MUTYH, NBN, NF1, PALB2, PMS2, POLD1, PTEN, RAD51C, RAD51D, RECQL, and TP53.   Negative genetic testing for the MSH2 inversion analysis ([place] inversion).  The test report has been scanned into EP[employer] and is located under the Molecular Pathology section of the Results Review tab.   We discussed with Ms. Mowrer that since the current genetic testing is not perfect, it is possible there may be a gene mutation in one of these genes that current testing cannot detect, but that chance is small. We also discussed,  that it is possible that another gene that has not yet been discovered, or that we have not yet tested, is responsible for the cancer diagnoses in the family, and it is, therefore, important to remain in touch with cancer genetics in the future so that we can continue to offer Ms. Monter the most up to date genetic testing.   Genetic testing did detect two Variants of Unknown Significance - one in the BARD1 gene  called c.1409A>G and another in the MUTYH gene called c.1276C>T.  At this time, it is unknown if these variants is associated with increased cancer risk or if these are a normal finding, but most variants such as these get reclassified to being inconsequential. It should not be used to make medical management decisions. With time, we suspect the lab will determine the significance of this variant, if any. If we do learn more about it, we will try to contact Ms. Moss to discuss it further. However, it is important to stay in touch with [place] periodically and keep the address and phone number up to date.  CANCER SCREENING RECOMMENDATIONS: This result is reassuring and indicates that Ms. Older likely does not have an increased risk for a future cancer due to a mutation in one of these genes. This normal test also suggests that Ms. Kleckley cancer was most likely not due to an inherited predisposition associated with one of these genes.  Most cancers happen by chance and this negative test suggests that her cancer falls into this category.  We, therefore, recommended she continue to follow the cancer management and screening guidelines provided by her oncology and primary healthcare provider.   RECOMMENDATIONS FOR FAMILY MEMBERS: Women in this family might be at some increased risk of developing cancer, over the general population risk, simply due to the family history of cancer. We recommended women in this family have a yearly mammogram beginning at age 25, or 17 years younger than the earliest onset of cancer, an annual clinical breast exam, and perform monthly breast self-exams. Women in this family should also have a gynecological exam as recommended by their primary provider. All family members should have a colonoscopy by age 47.  FOLLOW-UP: Lastly, we discussed with Ms. Taussig that cancer genetics is a rapidly advancing field and it is possible that new genetic tests will be appropriate for her and/or  her family members in the future. We encouraged her to remain in contact with cancer genetics on an annual basis so we can update her personal and family histories and let her know of advances in cancer genetics that may benefit this family.   Our contact number was provided. Ms. Sulewski questions were answered to her satisfaction, and she knows she is welcome to call us at anytime with additional questions or concerns.   Roma Kayser, MS, [facility] Certified Genetic Counselor [email or web address]

## 2016-09-25 ENCOUNTER — Ambulatory Visit
Admission: RE | Admit: 2016-09-25 | Discharge: 2016-09-25 | Disposition: A | Discharge status: Home / Self Care | Payer: BLUE CROSS/BLUE SHIELD | Source: Ambulatory Visit | Attending: Radiation Oncology | Admitting: Radiation Oncology

## 2016-09-25 DIAGNOSIS — Z170 Estrogen receptor positive status [ER+]: Secondary | ICD-10-CM

## 2016-09-25 DIAGNOSIS — Z510 Encounter for antineoplastic radiation therapy: Secondary | ICD-10-CM | POA: Diagnosis not present

## 2016-09-25 DIAGNOSIS — C50412 Malignant neoplasm of upper-outer quadrant of left female breast: Secondary | ICD-10-CM

## 2016-09-25 NOTE — Progress Notes (Signed)
  Radiation Oncology         [phone] ________________________________  Name: Doris Bray MRN: [id number]  Date: 09/25/2016  DOB: 05-26-1961  SIMULATION AND TREATMENT PLANNING NOTE    ICD-9-CM ICD-10-CM   1. Malignant neoplasm of upper-outer quadrant of left breast in female, estrogen receptor positive ([place]) 174.4 C50.412    V86.0 Z17.0     DIAGNOSIS:  Clinical stage (T1b, Nx, Mx) grade 1 IDC and DCIS of the left breast (ER/PR+, HER2-)  NARRATIVE:  The patient was brought to the [facility].  Identity was confirmed.  All relevant records and images related to the planned course of therapy were reviewed.  The patient freely provided informed written consent to proceed with treatment after reviewing the details related to the planned course of therapy. The consent form was witnessed and verified by the simulation staff.  Then, the patient was set-up in a stable reproducible  supine position for radiation therapy.  CT images were obtained.  Surface markings were placed.  The CT images were loaded into the planning software.  Then the target and avoidance structures were contoured.  Treatment planning then occurred.  The radiation prescription was entered and confirmed.  Then, I designed and supervised the construction of a total of 3 medically necessary complex treatment devices.  I have requested : 3D Simulation  I have requested a DVH of the following structures: heart lungs, lumpectomy cavity.  I have ordered:CBC  PLAN:  The patient will receive 50.4 Gy in 28 fractions followed by boost to the lumpectomy cavity of 10 Gy.  -----------------------------------  Blair Promise, PhD, MD  This document serves as a record of services personally performed by [name], MD. It was created on his behalf by Darcus Austin, a trained medical scribe. The creation of this record is based on the scribe's personal observations and the provider's statements to them. This document  has been checked and approved by the attending provider.

## 2016-09-29 DIAGNOSIS — Z510 Encounter for antineoplastic radiation therapy: Secondary | ICD-10-CM | POA: Diagnosis not present

## 2016-10-02 ENCOUNTER — Ambulatory Visit
Admission: RE | Admit: 2016-10-02 | Discharge: 2016-10-02 | Disposition: A | Discharge status: Home / Self Care | Payer: BLUE CROSS/BLUE SHIELD | Source: Ambulatory Visit | Attending: Radiation Oncology | Admitting: Radiation Oncology

## 2016-10-02 DIAGNOSIS — Z510 Encounter for antineoplastic radiation therapy: Secondary | ICD-10-CM | POA: Diagnosis not present

## 2016-10-06 ENCOUNTER — Ambulatory Visit
Admission: RE | Admit: 2016-10-06 | Discharge: 2016-10-06 | Disposition: A | Discharge status: Home / Self Care | Payer: BLUE CROSS/BLUE SHIELD | Source: Ambulatory Visit | Attending: Radiation Oncology | Admitting: Radiation Oncology

## 2016-10-06 DIAGNOSIS — C50412 Malignant neoplasm of upper-outer quadrant of left female breast: Secondary | ICD-10-CM

## 2016-10-06 DIAGNOSIS — Z510 Encounter for antineoplastic radiation therapy: Secondary | ICD-10-CM | POA: Diagnosis not present

## 2016-10-06 DIAGNOSIS — Z170 Estrogen receptor positive status [ER+]: Secondary | ICD-10-CM

## 2016-10-06 MED ORDER — RADIAPLEXRX EX GEL
Freq: Once | CUTANEOUS | Status: AC
  Administered 2016-10-06: 16:00:00 via TOPICAL

## 2016-10-06 NOTE — Progress Notes (Signed)
Pt here for patient teaching.  Pt given Radiation and You booklet, skin care instructions and Radiaplex gel.  Reviewed areas of pertinence such as fatigue, skin changes, breast tenderness and breast swelling . Pt able to give teach back of to pat skin and use unscented/gentle soap,apply Radiaplex bid, avoid applying anything to skin within 4 hours of treatment and to use an electric razor if they must shave. Pt demonstrated understanding and verbalizes understanding of information given and will contact nursing with any questions or concerns.          

## 2016-10-07 ENCOUNTER — Ambulatory Visit
Admission: RE | Admit: 2016-10-07 | Discharge: 2016-10-07 | Disposition: A | Discharge status: Home / Self Care | Payer: BLUE CROSS/BLUE SHIELD | Source: Ambulatory Visit | Attending: Radiation Oncology | Admitting: Radiation Oncology

## 2016-10-07 ENCOUNTER — Encounter: Payer: Self-pay | Admitting: Radiation Oncology

## 2016-10-07 VITALS — BP 146/97 | HR 97 | Temp 98.2°F | Resp 18 | Ht 69.0 in | Wt 185.8 lb

## 2016-10-07 DIAGNOSIS — Z510 Encounter for antineoplastic radiation therapy: Secondary | ICD-10-CM | POA: Diagnosis not present

## 2016-10-07 DIAGNOSIS — Z170 Estrogen receptor positive status [ER+]: Secondary | ICD-10-CM

## 2016-10-07 DIAGNOSIS — C50412 Malignant neoplasm of upper-outer quadrant of left female breast: Secondary | ICD-10-CM

## 2016-10-07 NOTE — Progress Notes (Signed)
  Radiation Oncology         [phone] ________________________________  Name: Doris Bray MRN: [id number]  Date: 10/07/2016  DOB: 1961/03/09  Weekly Radiation Therapy Management    ICD-9-CM ICD-10-CM   1. Malignant neoplasm of upper-outer quadrant of left breast in female, estrogen receptor positive ([facility]) 174.4 C50.412    V86.0 Z17.0      Current Dose: 3.6 Gy     Planned Dose:  60.4 Gy  Narrative . . . . . . . . The patient presents for routine under treatment assessment.                                    The patient completed 2 fractions of treatment to the left breast. The patient noticed no change of her skin in the treatment area. She is using radiaplex. She has a good appetite and no fatigue.                                  Set-up films were reviewed.                                 The chart was checked. Physical Findings. . .  height is 5\' 9"  (1.753 m) and weight is 185 lb 12.8 oz (84.3 kg). Her oral temperature is 98.2 F (36.8 C). Her blood pressure is 146/97 (abnormal) and her pulse is 97. Her respiration is 18 and oxygen saturation is 100%.  Lungs are clear to auscultation bilaterally. Heart has regular rate and rhythm. No skin reaction at this time. Impression . . . . . . . The patient is tolerating radiation. Plan . . . . . . . . . . . . Continue treatment as planned.  ________________________________   Blair Promise, PhD, MD  This document serves as a record of services personally performed by [name], MD. It was created on his behalf by Darcus Austin, a trained medical scribe. The creation of this record is based on the scribe's personal observations and the provider's statements to them. This document has been checked and approved by the attending provider.

## 2016-10-07 NOTE — Progress Notes (Signed)
Doris Bray has received 2 fractions to the left breast.  No change in skin in tact.  Using Radiaplex gel and Tom's deodorant.  Appetite is good.  Denies fatigue.  No questions from her patient teaching on yesterday. Wt Readings from Last 3 Encounters:  10/07/16 185 lb 12.8 oz (84.3 kg)  08/18/16 188 lb 3.2 oz (85.4 kg)  08/12/16 185 lb (83.9 kg)  BP (!) 146/97   Pulse 97   Temp 98.2 F (36.8 C) (Oral)   Resp 18   Ht 5\' 9"  (1.753 m)   Wt 185 lb 12.8 oz (84.3 kg)   SpO2 100%   BMI 27.44 kg/m

## 2016-10-08 ENCOUNTER — Ambulatory Visit
Admission: RE | Admit: 2016-10-08 | Discharge: 2016-10-08 | Disposition: A | Discharge status: Home / Self Care | Payer: BLUE CROSS/BLUE SHIELD | Source: Ambulatory Visit | Attending: Radiation Oncology | Admitting: Radiation Oncology

## 2016-10-08 DIAGNOSIS — Z510 Encounter for antineoplastic radiation therapy: Secondary | ICD-10-CM | POA: Diagnosis not present

## 2016-10-09 ENCOUNTER — Ambulatory Visit
Admission: RE | Admit: 2016-10-09 | Discharge: 2016-10-09 | Disposition: A | Discharge status: Home / Self Care | Payer: BLUE CROSS/BLUE SHIELD | Source: Ambulatory Visit | Attending: Radiation Oncology | Admitting: Radiation Oncology

## 2016-10-09 DIAGNOSIS — Z510 Encounter for antineoplastic radiation therapy: Secondary | ICD-10-CM | POA: Diagnosis not present

## 2016-10-10 ENCOUNTER — Ambulatory Visit
Admission: RE | Admit: 2016-10-10 | Discharge: 2016-10-10 | Disposition: A | Discharge status: Home / Self Care | Payer: BLUE CROSS/BLUE SHIELD | Source: Ambulatory Visit | Attending: Radiation Oncology | Admitting: Radiation Oncology

## 2016-10-10 DIAGNOSIS — Z510 Encounter for antineoplastic radiation therapy: Secondary | ICD-10-CM | POA: Diagnosis not present

## 2016-10-13 ENCOUNTER — Ambulatory Visit
Admission: RE | Admit: 2016-10-13 | Discharge: 2016-10-13 | Disposition: A | Discharge status: Home / Self Care | Payer: BLUE CROSS/BLUE SHIELD | Source: Ambulatory Visit | Attending: Radiation Oncology | Admitting: Radiation Oncology

## 2016-10-13 DIAGNOSIS — Z510 Encounter for antineoplastic radiation therapy: Secondary | ICD-10-CM | POA: Diagnosis not present

## 2016-10-14 ENCOUNTER — Ambulatory Visit
Admission: RE | Admit: 2016-10-14 | Discharge: 2016-10-14 | Disposition: A | Discharge status: Home / Self Care | Payer: BLUE CROSS/BLUE SHIELD | Source: Ambulatory Visit | Attending: Radiation Oncology | Admitting: Radiation Oncology

## 2016-10-14 ENCOUNTER — Encounter: Payer: Self-pay | Admitting: Radiation Oncology

## 2016-10-14 VITALS — BP 163/91 | HR 79 | Temp 97.5°F | Ht 69.0 in | Wt 185.8 lb

## 2016-10-14 DIAGNOSIS — Z170 Estrogen receptor positive status [ER+]: Secondary | ICD-10-CM

## 2016-10-14 DIAGNOSIS — C50412 Malignant neoplasm of upper-outer quadrant of left female breast: Secondary | ICD-10-CM

## 2016-10-14 DIAGNOSIS — Z510 Encounter for antineoplastic radiation therapy: Secondary | ICD-10-CM | POA: Diagnosis not present

## 2016-10-14 NOTE — Progress Notes (Addendum)
Doris Bray has completed 7 fractions to her left breast.  She reports having occasional sharp pains in her left breast.  She reports feeling fatigued over the weekend and has been falling asleep earlier than usual.  The skin on her left breast is pink.  BP (!) 163/91 (BP Location: Right Arm, Patient Position: Sitting)   Pulse 79   Temp 97.5 F (36.4 C) (Oral)   Ht 5\' 9"  (1.753 m)   Wt 185 lb 12.8 oz (84.3 kg)   SpO2 100%   BMI 27.44 kg/m    Wt Readings from Last 3 Encounters:  10/14/16 185 lb 12.8 oz (84.3 kg)  10/07/16 185 lb 12.8 oz (84.3 kg)  08/18/16 188 lb 3.2 oz (85.4 kg)

## 2016-10-14 NOTE — Progress Notes (Signed)
  Radiation Oncology         [phone] ________________________________  Name: Doris Bray MRN: [id number]  Date: 10/14/2016  DOB: Aug 06, 1961  Weekly Radiation Therapy Management    ICD-9-CM ICD-10-CM   1. Malignant neoplasm of upper-outer quadrant of left breast in female, estrogen receptor positive ([place]) 174.4 C50.412    V86.0 Z17.0      Current Dose: 12.6 Gy     Planned Dose:  60.4 Gy  Narrative . . . . . . . . The patient presents for routine under treatment assessment.                                    [name] has completed 7 fractions to her left breast.  She reports having occasional sharp pains in her left breast.  She reports feeling fatigued over the weekend and has been falling asleep earlier than usual.  The nurse notes skin on her left breast is pink.                                  Set-up films were reviewed.                                 The chart was checked. Physical Findings. . .  height is 5\' 9"  (1.753 m) and weight is 185 lb 12.8 oz (84.3 kg). Her oral temperature is 97.5 F (36.4 C). Her blood pressure is 163/91 (abnormal) and her pulse is 79. Her oxygen saturation is 100%.  Lungs are clear to auscultation bilaterally. Heart has regular rate and rhythm. Slight erythema of the left breast. Impression . . . . . . . The patient is tolerating radiation. Her breast pain is likely nerves regenerating from surgery. Plan . . . . . . . . . . . . Continue treatment as planned.  ________________________________   Blair Promise, PhD, MD  This document serves as a record of services personally performed by [name], MD. It was created on his behalf by Darcus Austin, a trained medical scribe. The creation of this record is based on the scribe's personal observations and the provider's statements to them. This document has been checked and approved by the attending provider.

## 2016-10-15 ENCOUNTER — Ambulatory Visit
Admission: RE | Admit: 2016-10-15 | Discharge: 2016-10-15 | Disposition: A | Discharge status: Home / Self Care | Payer: BLUE CROSS/BLUE SHIELD | Source: Ambulatory Visit | Attending: Radiation Oncology | Admitting: Radiation Oncology

## 2016-10-15 DIAGNOSIS — Z510 Encounter for antineoplastic radiation therapy: Secondary | ICD-10-CM | POA: Diagnosis not present

## 2016-10-16 ENCOUNTER — Ambulatory Visit
Admission: RE | Admit: 2016-10-16 | Discharge: 2016-10-16 | Disposition: A | Discharge status: Home / Self Care | Payer: BLUE CROSS/BLUE SHIELD | Source: Ambulatory Visit | Attending: Radiation Oncology | Admitting: Radiation Oncology

## 2016-10-16 DIAGNOSIS — Z510 Encounter for antineoplastic radiation therapy: Secondary | ICD-10-CM | POA: Diagnosis not present

## 2016-10-17 ENCOUNTER — Ambulatory Visit
Admission: RE | Admit: 2016-10-17 | Discharge: 2016-10-17 | Disposition: A | Discharge status: Home / Self Care | Payer: BLUE CROSS/BLUE SHIELD | Source: Ambulatory Visit | Attending: Radiation Oncology | Admitting: Radiation Oncology

## 2016-10-17 DIAGNOSIS — Z510 Encounter for antineoplastic radiation therapy: Secondary | ICD-10-CM | POA: Diagnosis not present

## 2016-10-20 ENCOUNTER — Ambulatory Visit
Admission: RE | Admit: 2016-10-20 | Discharge: 2016-10-20 | Disposition: A | Discharge status: Home / Self Care | Payer: BLUE CROSS/BLUE SHIELD | Source: Ambulatory Visit | Attending: Radiation Oncology | Admitting: Radiation Oncology

## 2016-10-20 DIAGNOSIS — Z510 Encounter for antineoplastic radiation therapy: Secondary | ICD-10-CM | POA: Diagnosis not present

## 2016-10-21 ENCOUNTER — Ambulatory Visit
Admission: RE | Admit: 2016-10-21 | Discharge: 2016-10-21 | Disposition: A | Discharge status: Home / Self Care | Payer: BLUE CROSS/BLUE SHIELD | Source: Ambulatory Visit | Attending: Radiation Oncology | Admitting: Radiation Oncology

## 2016-10-21 ENCOUNTER — Encounter: Payer: Self-pay | Admitting: Radiation Oncology

## 2016-10-21 VITALS — BP 147/83 | HR 86 | Temp 98.2°F | Resp 18 | Ht 69.0 in | Wt 184.2 lb

## 2016-10-21 DIAGNOSIS — C50412 Malignant neoplasm of upper-outer quadrant of left female breast: Secondary | ICD-10-CM

## 2016-10-21 DIAGNOSIS — Z510 Encounter for antineoplastic radiation therapy: Secondary | ICD-10-CM | POA: Diagnosis not present

## 2016-10-21 DIAGNOSIS — Z170 Estrogen receptor positive status [ER+]: Secondary | ICD-10-CM

## 2016-10-21 NOTE — Progress Notes (Signed)
Doris Bray has completed 12 fractions to her left breast.  She reports no sharp pains in her left breast this past week.  She reports feeling fatigued in the afternoons.  The skin on her left breast is pink, using Radiaplex gel. Appetite is fine. Wt Readings from Last 3 Encounters:  10/21/16 184 lb 3.2 oz (83.6 kg)  10/14/16 185 lb 12.8 oz (84.3 kg)  10/07/16 185 lb 12.8 oz (84.3 kg)  BP (!) 147/83   Pulse 86   Temp 98.2 F (36.8 C) (Oral)   Resp 18   Ht 5\' 9"  (1.753 m)   Wt 184 lb 3.2 oz (83.6 kg)   SpO2 98%   BMI 27.20 kg/m

## 2016-10-21 NOTE — Progress Notes (Signed)
  Radiation Oncology         (336) [phone] ________________________________  Name: Doris Bray MRN: [id number]  Date: 10/21/2016  DOB: [birth year]  Weekly Radiation Therapy Management    ICD-9-CM ICD-10-CM   1. Malignant neoplasm of upper-outer quadrant of left breast in female, estrogen receptor positive ([place]) 174.4 C50.412    V86.0 Z17.0      Current Dose: 21.6 Gy     Planned Dose:  60.4 Gy  Narrative . . . . . . . . The patient presents for routine under treatment assessment.                                    Doris Bray has completed 12 fractions to her left breast. The patient reports she has not experienced any more sharp pains in the breast this week. She reports fatigue in the afternoons. She is using Radiaplex as directed. Overall the patient is without complaint.                                  Set-up films were reviewed.                                 The chart was checked. Physical Findings. . .  height is 5\' 9"  (1.753 m) and weight is 184 lb 3.2 oz (83.6 kg). Her oral temperature is 98.2 F (36.8 C). Her blood pressure is 147/83 (abnormal) and her pulse is 86. Her respiration is 18 and oxygen saturation is 98%.  Lungs are clear to auscultation bilaterally. Heart has regular rate and rhythm. Slight erythema of the left breast, with some radiation dermatitis in the upper-inner aspect. Impression . . . . . . . The patient is tolerating radiation.  Plan . . . . . . . . . . . . Continue treatment as planned.  ________________________________   Blair Promise, PhD, MD  This document serves as a record of services personally performed by [name], MD. It was created on his behalf by Maryla Morrow, a trained medical scribe. The creation of this record is based on the scribe's personal observations and the provider's statements to them. This document has been checked and approved by the attending provider.

## 2016-10-22 ENCOUNTER — Ambulatory Visit
Admission: RE | Admit: 2016-10-22 | Discharge: 2016-10-22 | Disposition: A | Discharge status: Home / Self Care | Payer: BLUE CROSS/BLUE SHIELD | Source: Ambulatory Visit | Attending: Radiation Oncology | Admitting: Radiation Oncology

## 2016-10-22 DIAGNOSIS — Z510 Encounter for antineoplastic radiation therapy: Secondary | ICD-10-CM | POA: Diagnosis not present

## 2016-10-23 ENCOUNTER — Ambulatory Visit
Admission: RE | Admit: 2016-10-23 | Discharge: 2016-10-23 | Disposition: A | Discharge status: Home / Self Care | Payer: BLUE CROSS/BLUE SHIELD | Source: Ambulatory Visit | Attending: Radiation Oncology | Admitting: Radiation Oncology

## 2016-10-23 DIAGNOSIS — Z510 Encounter for antineoplastic radiation therapy: Secondary | ICD-10-CM | POA: Diagnosis not present

## 2016-10-24 ENCOUNTER — Ambulatory Visit
Admission: RE | Admit: 2016-10-24 | Discharge: 2016-10-24 | Disposition: A | Discharge status: Home / Self Care | Payer: BLUE CROSS/BLUE SHIELD | Source: Ambulatory Visit | Attending: Radiation Oncology | Admitting: Radiation Oncology

## 2016-10-24 ENCOUNTER — Telehealth: Payer: Self-pay | Admitting: Hematology and Oncology

## 2016-10-24 DIAGNOSIS — Z510 Encounter for antineoplastic radiation therapy: Secondary | ICD-10-CM | POA: Diagnosis not present

## 2016-10-24 NOTE — Telephone Encounter (Signed)
Patient called and wanted to know if she needed to come to her appointment for Dr Lindi Adie on Tuesday she is still doing radiation and was told she would not see him until after she finished radiation

## 2016-10-27 ENCOUNTER — Ambulatory Visit
Admission: RE | Admit: 2016-10-27 | Discharge: 2016-10-27 | Disposition: A | Discharge status: Home / Self Care | Payer: BLUE CROSS/BLUE SHIELD | Source: Ambulatory Visit | Attending: Radiation Oncology | Admitting: Radiation Oncology

## 2016-10-27 ENCOUNTER — Telehealth: Payer: Self-pay

## 2016-10-27 DIAGNOSIS — Z510 Encounter for antineoplastic radiation therapy: Secondary | ICD-10-CM | POA: Diagnosis not present

## 2016-10-27 NOTE — Telephone Encounter (Signed)
  Called pt regarding vm to reschedule after radiation treatment in mid April. Scheduled pt to April 16th at 1pm. Pt confirmed time and date. Will cancel appt for tomorrow 3/20. Pt verbalized understanding.

## 2016-10-28 ENCOUNTER — Ambulatory Visit
Admission: RE | Admit: 2016-10-28 | Discharge: 2016-10-28 | Disposition: A | Discharge status: Home / Self Care | Payer: BLUE CROSS/BLUE SHIELD | Source: Ambulatory Visit | Attending: Radiation Oncology | Admitting: Radiation Oncology

## 2016-10-28 ENCOUNTER — Ambulatory Visit: Payer: BLUE CROSS/BLUE SHIELD | Admitting: Hematology and Oncology

## 2016-10-28 ENCOUNTER — Other Ambulatory Visit: Payer: BLUE CROSS/BLUE SHIELD

## 2016-10-28 ENCOUNTER — Encounter: Payer: Self-pay | Admitting: Radiation Oncology

## 2016-10-28 VITALS — BP 143/91 | HR 83 | Temp 98.0°F | Ht 69.0 in | Wt 184.6 lb

## 2016-10-28 DIAGNOSIS — L598 Other specified disorders of the skin and subcutaneous tissue related to radiation: Secondary | ICD-10-CM | POA: Insufficient documentation

## 2016-10-28 DIAGNOSIS — Z170 Estrogen receptor positive status [ER+]: Secondary | ICD-10-CM | POA: Insufficient documentation

## 2016-10-28 DIAGNOSIS — Z510 Encounter for antineoplastic radiation therapy: Secondary | ICD-10-CM | POA: Diagnosis not present

## 2016-10-28 DIAGNOSIS — C50412 Malignant neoplasm of upper-outer quadrant of left female breast: Secondary | ICD-10-CM | POA: Diagnosis not present

## 2016-10-28 MED ORDER — BIAFINE EX EMUL
Freq: Once | CUTANEOUS | Status: AC
  Administered 2016-10-28: 17:00:00 via TOPICAL

## 2016-10-28 NOTE — Progress Notes (Signed)
Doris Bray has completed 17 fractions to her left breast.  She reports her left breast is tender.  She is having fatigue.  She is using radiaplex bid.  The skin on the upper inner portion of her left breast has dermatitis and she has been given biafine to try.  Recommended using hydrocortisone 1% cream for itching.  She also has a small open area on the edge of her lymph node scar.    BP (!) 151/93 (BP Location: Right Arm, Patient Position: Sitting)   Pulse 79   Temp 98 F (36.7 C) (Oral)   Ht 5\' 9"  (1.753 m)   Wt 184 lb 9.6 oz (83.7 kg)   SpO2 100%   BMI 27.26 kg/m   Wt Readings from Last 3 Encounters:  10/28/16 184 lb 9.6 oz (83.7 kg)  10/21/16 184 lb 3.2 oz (83.6 kg)  10/14/16 185 lb 12.8 oz (84.3 kg)

## 2016-10-28 NOTE — Progress Notes (Signed)
  Radiation Oncology         (336) [phone] ________________________________  Name: Doris Bray MRN: [id number]  Date: 10/28/2016  DOB: November 18, 1960  Weekly Radiation Therapy Management    ICD-9-CM ICD-10-CM   1. Malignant neoplasm of upper-outer quadrant of left breast in female, estrogen receptor positive (HCC) 174.4 C50.412 topical emolient (BIAFINE) emulsion   V86.0 Z17.0      Current Dose: 30.6 Gy     Planned Dose:  60.4 Gy  Narrative . . . . . . . . The patient presents for routine under treatment assessment.                                    Doris Bray has completed 17 fractions to her left breast.  She also endorses fatigue. Pt reports her left breast is tender.Pt states she tries to sleep on stomach and states that this is when she experiences tenderness.  Pt reports she is using radiaplex BID.                                  Set-up films were reviewed.                                 The chart was checked. Physical Findings. . .  height is 5\' 9"  (1.753 m) and weight is 184 lb 9.6 oz (83.7 kg). Her oral temperature is 98 F (36.7 C). Her blood pressure is 143/91 (abnormal) and her pulse is 83. Her oxygen saturation is 100%.  Lungs are clear to auscultation bilaterally. Heart has regular rate and rhythm.  Radiation dermatitis of left breast at the upper inner aspect. Mild erythema throughout with no skin breakdown. Impression . . . . . . . The patient is tolerating radiation.  Plan . . . . . . . . . . . . Continue treatment as planned. Pt was provided Biafine by nursing. ________________________________   Blair Promise, PhD, MD  This document serves as a record of services personally performed by [name], MD. It was created on his behalf by Maryla Morrow, a trained medical scribe. The creation of this record is based on the scribe's personal observations and the provider's statements to them. This document has been checked and approved by the attending provider.

## 2016-10-29 ENCOUNTER — Ambulatory Visit: Payer: BLUE CROSS/BLUE SHIELD | Admitting: Oncology

## 2016-10-29 ENCOUNTER — Ambulatory Visit
Admission: RE | Admit: 2016-10-29 | Discharge: 2016-10-29 | Disposition: A | Discharge status: Home / Self Care | Payer: BLUE CROSS/BLUE SHIELD | Source: Ambulatory Visit | Attending: Radiation Oncology | Admitting: Radiation Oncology

## 2016-10-29 ENCOUNTER — Other Ambulatory Visit: Payer: BLUE CROSS/BLUE SHIELD

## 2016-10-29 DIAGNOSIS — Z510 Encounter for antineoplastic radiation therapy: Secondary | ICD-10-CM | POA: Diagnosis not present

## 2016-10-30 ENCOUNTER — Ambulatory Visit
Admission: RE | Admit: 2016-10-30 | Discharge: 2016-10-30 | Disposition: A | Discharge status: Home / Self Care | Payer: BLUE CROSS/BLUE SHIELD | Source: Ambulatory Visit | Attending: Radiation Oncology | Admitting: Radiation Oncology

## 2016-10-30 DIAGNOSIS — Z510 Encounter for antineoplastic radiation therapy: Secondary | ICD-10-CM | POA: Diagnosis not present

## 2016-10-31 ENCOUNTER — Ambulatory Visit
Admission: RE | Admit: 2016-10-31 | Discharge: 2016-10-31 | Disposition: A | Discharge status: Home / Self Care | Payer: BLUE CROSS/BLUE SHIELD | Source: Ambulatory Visit | Attending: Radiation Oncology | Admitting: Radiation Oncology

## 2016-10-31 DIAGNOSIS — Z510 Encounter for antineoplastic radiation therapy: Secondary | ICD-10-CM | POA: Diagnosis not present

## 2016-11-03 ENCOUNTER — Ambulatory Visit
Admission: RE | Admit: 2016-11-03 | Discharge: 2016-11-03 | Disposition: A | Discharge status: Home / Self Care | Payer: BLUE CROSS/BLUE SHIELD | Source: Ambulatory Visit | Attending: Radiation Oncology | Admitting: Radiation Oncology

## 2016-11-03 DIAGNOSIS — Z510 Encounter for antineoplastic radiation therapy: Secondary | ICD-10-CM | POA: Diagnosis not present

## 2016-11-04 ENCOUNTER — Ambulatory Visit
Admission: RE | Admit: 2016-11-04 | Discharge: 2016-11-04 | Disposition: A | Discharge status: Home / Self Care | Payer: BLUE CROSS/BLUE SHIELD | Source: Ambulatory Visit | Attending: Radiation Oncology | Admitting: Radiation Oncology

## 2016-11-04 VITALS — BP 158/89 | HR 84 | Temp 98.5°F | Resp 18 | Wt 184.6 lb

## 2016-11-04 DIAGNOSIS — C50412 Malignant neoplasm of upper-outer quadrant of left female breast: Secondary | ICD-10-CM

## 2016-11-04 DIAGNOSIS — Z170 Estrogen receptor positive status [ER+]: Secondary | ICD-10-CM

## 2016-11-04 DIAGNOSIS — Z510 Encounter for antineoplastic radiation therapy: Secondary | ICD-10-CM | POA: Diagnosis not present

## 2016-11-04 NOTE — Progress Notes (Addendum)
Doris Bray is here today after completing her 22nd fraction to left breast.   Patient complains of pain to left breast near the incision line and under breast.  She rates this pain a 3 out of 10.  She notices the pain more so at night and it wakes her often.  The skin to the left breast is red with rash present and some peeling under left breast.  Patient is using Biafine at bedtime and radiaplex twice daily. For skin with peeling instructed to use Neosporin to that area.  Patient states she has a decline in her energy.  She states her appetite is normal.    Vitals:   11/04/16 1610  BP: (!) 158/89  Pulse: 84  Resp: 18  Temp: 98.5 F (36.9 C)  TempSrc: Oral  SpO2: 100%  Weight: 184 lb 9.6 oz (83.7 kg)    Wt Readings from Last 3 Encounters:  11/04/16 184 lb 9.6 oz (83.7 kg)  10/28/16 184 lb 9.6 oz (83.7 kg)  10/21/16 184 lb 3.2 oz (83.6 kg)

## 2016-11-04 NOTE — Progress Notes (Signed)
  Radiation Oncology         [phone] ________________________________  Name: Doris Bray MRN: [id number]  Date: 11/04/2016  DOB: 1961/07/18  Weekly Radiation Therapy Management    ICD-9-CM ICD-10-CM   1. Malignant neoplasm of upper-outer quadrant of left breast in female, estrogen receptor positive (HCC) 174.4 C50.412    V86.0 Z17.0      Current Dose: 39.6 Gy     Planned Dose:  60.4 Gy  Narrative . . . . . . . . The patient presents for routine under treatment assessment.                                    Doris Bray is here today after completing her 22nd fraction to left breast.   Patient complains of pain to left breast near the incision line and under the breast.  She rates this pain as a 3/10.  She notices the pain more so at night and it wakes her often.  The skin to the left breast is red with rash present and some peeling under left breast.  Patient is using Biafine at bedtime and radiaplex twice daily. For skin with peeling the nurse instructed her to use Neosporin to that area.  Patient states she has a decline in her energy from the pain and hot flashes keeping her up at night. She states her appetite is normal.                                  Set-up films were reviewed.                                 The chart was checked. Physical Findings. . .  weight is 184 lb 9.6 oz (83.7 kg). Her oral temperature is 98.5 F (36.9 C). Her blood pressure is 158/89 (abnormal) and her pulse is 84. Her respiration is 18 and oxygen saturation is 100%.  Lungs are clear to auscultation bilaterally. Heart has regular rate and rhythm.  Radiation dermatitis of left breast at the upper inner aspect. erythema throughout the remainder of the breast with no skin breakdown. Impression . . . . . . . The patient is tolerating radiation.  Plan . . . . . . . . . . . . Continue treatment as planned. ________________________________   Blair Promise, PhD, MD  This document serves as a  record of services personally performed by [name], MD. It was created on his behalf by Darcus Austin, a trained medical scribe. The creation of this record is based on the scribe's personal observations and the provider's statements to them. This document has been checked and approved by the attending provider.

## 2016-11-05 ENCOUNTER — Ambulatory Visit
Admission: RE | Admit: 2016-11-05 | Discharge: 2016-11-05 | Disposition: A | Discharge status: Home / Self Care | Payer: BLUE CROSS/BLUE SHIELD | Source: Ambulatory Visit | Attending: Radiation Oncology | Admitting: Radiation Oncology

## 2016-11-05 ENCOUNTER — Ambulatory Visit: Payer: BLUE CROSS/BLUE SHIELD | Admitting: Radiation Oncology

## 2016-11-05 DIAGNOSIS — Z510 Encounter for antineoplastic radiation therapy: Secondary | ICD-10-CM | POA: Diagnosis not present

## 2016-11-06 ENCOUNTER — Ambulatory Visit
Admission: RE | Admit: 2016-11-06 | Discharge: 2016-11-06 | Disposition: A | Discharge status: Home / Self Care | Payer: BLUE CROSS/BLUE SHIELD | Source: Ambulatory Visit | Attending: Radiation Oncology | Admitting: Radiation Oncology

## 2016-11-06 DIAGNOSIS — Z510 Encounter for antineoplastic radiation therapy: Secondary | ICD-10-CM | POA: Diagnosis not present

## 2016-11-07 ENCOUNTER — Ambulatory Visit
Admission: RE | Admit: 2016-11-07 | Discharge: 2016-11-07 | Disposition: A | Discharge status: Home / Self Care | Payer: BLUE CROSS/BLUE SHIELD | Source: Ambulatory Visit | Attending: Radiation Oncology | Admitting: Radiation Oncology

## 2016-11-07 DIAGNOSIS — Z510 Encounter for antineoplastic radiation therapy: Secondary | ICD-10-CM | POA: Diagnosis not present

## 2016-11-07 DIAGNOSIS — C50412 Malignant neoplasm of upper-outer quadrant of left female breast: Secondary | ICD-10-CM

## 2016-11-07 MED ORDER — RADIAPLEXRX EX GEL
Freq: Once | CUTANEOUS | Status: AC
  Administered 2016-11-07: 16:00:00 via TOPICAL

## 2016-11-10 ENCOUNTER — Ambulatory Visit
Admission: RE | Admit: 2016-11-10 | Discharge: 2016-11-10 | Disposition: A | Discharge status: Home / Self Care | Payer: BLUE CROSS/BLUE SHIELD | Source: Ambulatory Visit | Attending: Radiation Oncology | Admitting: Radiation Oncology

## 2016-11-10 DIAGNOSIS — Z510 Encounter for antineoplastic radiation therapy: Secondary | ICD-10-CM | POA: Diagnosis not present

## 2016-11-11 ENCOUNTER — Ambulatory Visit
Admission: RE | Admit: 2016-11-11 | Discharge: 2016-11-11 | Disposition: A | Discharge status: Home / Self Care | Payer: BLUE CROSS/BLUE SHIELD | Source: Ambulatory Visit | Attending: Radiation Oncology | Admitting: Radiation Oncology

## 2016-11-11 ENCOUNTER — Encounter: Payer: Self-pay | Admitting: Radiation Oncology

## 2016-11-11 VITALS — BP 159/98 | HR 91 | Temp 97.8°F | Ht 69.0 in | Wt 186.4 lb

## 2016-11-11 DIAGNOSIS — Z510 Encounter for antineoplastic radiation therapy: Secondary | ICD-10-CM | POA: Diagnosis not present

## 2016-11-11 DIAGNOSIS — C50412 Malignant neoplasm of upper-outer quadrant of left female breast: Secondary | ICD-10-CM

## 2016-11-11 MED ORDER — OXYCODONE-ACETAMINOPHEN 5-325 MG PO TABS: 1.0000 | ORAL_TABLET | ORAL | 0 refills | Status: DC | PRN

## 2016-11-11 NOTE — Progress Notes (Signed)
Doris Bray has completed 28 fractions to her left breast.  She reports having burning pain with sharp pains at a 8/10 in her left breast.  She has been taking 3 advil pm's at night to sleep.  She reports having fatigue.  She is using biafine, radiaplex and neosporin under her breast.  The skin on her left breast is red with dermatitis.  She has a small open area under her left arm.  She has been given hydrogel pads to try.  BP (!) 159/98 (BP Location: Right Arm, Patient Position: Sitting)   Pulse 91   Temp 97.8 F (36.6 C) (Oral)   Ht 5\' 9"  (1.753 m)   Wt 186 lb 6.4 oz (84.6 kg)   SpO2 98%   BMI 27.53 kg/m    Wt Readings from Last 3 Encounters:  11/11/16 186 lb 6.4 oz (84.6 kg)  11/04/16 184 lb 9.6 oz (83.7 kg)  10/28/16 184 lb 9.6 oz (83.7 kg)

## 2016-11-11 NOTE — Progress Notes (Signed)
  Radiation Oncology         (336) [phone] ________________________________  Name: Doris Bray MRN: [id number]  Date: 11/11/2016  DOB: January 15, 1961  Weekly Radiation Therapy Management    ICD-9-CM ICD-10-CM   1. Malignant neoplasm of upper-outer quadrant of left female breast, unspecified estrogen receptor status (HCC) 174.4 C50.412      Current Dose: 48.6 Gy     Planned Dose:  60.4 Gy  Narrative . . . . . . . . The patient presents for routine under treatment assessment.                                    Doris Bray has completed 28 fractions to her left breast.  She reports having burning pain with sharp pains as a 8/10 in her left breast.  She has been taking 3 advil pm's at night to help her sleep from the pain. She is not taking any pain medication during the day.  She reports having fatigue.  She is using biafine, radiaplex, and neosporin under her breast.  The nurse notes the skin on her left breast is red with dermatitis.  She has a small open area under her left arm.                                  Set-up films were reviewed.                                 The chart was checked. Physical Findings. . .  height is 5\' 9"  (1.753 m) and weight is 186 lb 6.4 oz (84.6 kg). Her oral temperature is 97.8 F (36.6 C). Her blood pressure is 159/98 (abnormal) and her pulse is 91. Her oxygen saturation is 98%.  Lungs are clear to auscultation bilaterally. Heart has regular rate and rhythm.  Radiation dermatitis of left breast at the upper inner aspect. erythema throughout the remainder of the breast with no skin breakdown. Healing moist desquamation in the left IM fold and left axillary region. Diffuse erythema throughout the left breast. Impression . . . . . . . The patient is tolerating radiation.  Plan . . . . . . . . . . . . Continue treatment as planned. She has been given hydrogel pads to apply in the IM fold. I will prescribe Percocet for her pain, primary at night to help her  sleep. ________________________________   Blair Promise, PhD, MD  This document serves as a record of services personally performed by [name], MD. It was created on his behalf by Darcus Austin, a trained medical scribe. The creation of this record is based on the scribe's personal observations and the provider's statements to them. This document has been checked and approved by the attending provider.

## 2016-11-12 ENCOUNTER — Ambulatory Visit
Admission: RE | Admit: 2016-11-12 | Discharge: 2016-11-12 | Disposition: A | Discharge status: Home / Self Care | Payer: BLUE CROSS/BLUE SHIELD | Source: Ambulatory Visit | Attending: Radiation Oncology | Admitting: Radiation Oncology

## 2016-11-12 DIAGNOSIS — Z510 Encounter for antineoplastic radiation therapy: Secondary | ICD-10-CM | POA: Diagnosis not present

## 2016-11-13 ENCOUNTER — Ambulatory Visit
Admission: RE | Admit: 2016-11-13 | Discharge: 2016-11-13 | Disposition: A | Discharge status: Home / Self Care | Payer: BLUE CROSS/BLUE SHIELD | Source: Ambulatory Visit | Attending: Radiation Oncology | Admitting: Radiation Oncology

## 2016-11-13 DIAGNOSIS — Z510 Encounter for antineoplastic radiation therapy: Secondary | ICD-10-CM | POA: Diagnosis not present

## 2016-11-14 ENCOUNTER — Ambulatory Visit
Admission: RE | Admit: 2016-11-14 | Discharge: 2016-11-14 | Disposition: A | Discharge status: Home / Self Care | Payer: BLUE CROSS/BLUE SHIELD | Source: Ambulatory Visit | Attending: Radiation Oncology | Admitting: Radiation Oncology

## 2016-11-14 DIAGNOSIS — Z510 Encounter for antineoplastic radiation therapy: Secondary | ICD-10-CM | POA: Diagnosis not present

## 2016-11-17 ENCOUNTER — Ambulatory Visit
Admission: RE | Admit: 2016-11-17 | Discharge: 2016-11-17 | Disposition: A | Discharge status: Home / Self Care | Payer: BLUE CROSS/BLUE SHIELD | Source: Ambulatory Visit | Attending: Radiation Oncology | Admitting: Radiation Oncology

## 2016-11-17 DIAGNOSIS — Z510 Encounter for antineoplastic radiation therapy: Secondary | ICD-10-CM | POA: Diagnosis not present

## 2016-11-18 ENCOUNTER — Ambulatory Visit
Admission: RE | Admit: 2016-11-18 | Discharge: 2016-11-18 | Disposition: A | Discharge status: Home / Self Care | Payer: BLUE CROSS/BLUE SHIELD | Source: Ambulatory Visit | Attending: Radiation Oncology | Admitting: Radiation Oncology

## 2016-11-18 ENCOUNTER — Ambulatory Visit: Payer: BLUE CROSS/BLUE SHIELD | Admitting: Radiation Oncology

## 2016-11-18 DIAGNOSIS — Z510 Encounter for antineoplastic radiation therapy: Secondary | ICD-10-CM | POA: Diagnosis not present

## 2016-11-18 DIAGNOSIS — C50412 Malignant neoplasm of upper-outer quadrant of left female breast: Secondary | ICD-10-CM

## 2016-11-18 MED ORDER — BIAFINE EX EMUL
Freq: Two times a day (BID) | CUTANEOUS | Status: DC
  Administered 2016-11-18: 16:00:00 via TOPICAL

## 2016-11-19 ENCOUNTER — Ambulatory Visit
Admission: RE | Admit: 2016-11-19 | Discharge: 2016-11-19 | Disposition: A | Discharge status: Home / Self Care | Payer: BLUE CROSS/BLUE SHIELD | Source: Ambulatory Visit | Attending: Radiation Oncology | Admitting: Radiation Oncology

## 2016-11-19 ENCOUNTER — Telehealth: Payer: Self-pay | Admitting: *Deleted

## 2016-11-19 DIAGNOSIS — Z510 Encounter for antineoplastic radiation therapy: Secondary | ICD-10-CM | POA: Diagnosis not present

## 2016-11-19 NOTE — Telephone Encounter (Signed)
  Oncology Nurse Navigator Documentation  Navigator Location: [facility] (11/19/16 1200)   )Navigator Encounter Type: Telephone (11/19/16 1200) Telephone: Doris Bray Call (11/19/16 1200)  Left vm congratulating pt on completion of xrt. Informed of Survivorship program. Contact information provided for questions.                 Patient Visit Type: RadOnc (11/19/16 1200) Treatment Phase: Final Radiation Tx (11/19/16 1200)     Interventions: Referrals (11/19/16 1200) Referrals: Survivorship (11/19/16 1200)          Acuity: Level 1 (11/19/16 1200)         Time Spent with Patient: 15 (11/19/16 1200)

## 2016-11-20 ENCOUNTER — Encounter: Payer: Self-pay | Admitting: Radiation Oncology

## 2016-11-20 ENCOUNTER — Telehealth: Payer: Self-pay | Admitting: *Deleted

## 2016-11-20 NOTE — Progress Notes (Signed)
  Radiation Oncology         (336) [phone] ________________________________  Name: Doris Bray MRN: [id number]  Date: 11/20/2016  DOB: July 12, 1961  End of Treatment Note  Diagnosis:   Clinical stage T1bNxMx grade 1 IDC and DCIS of the left breast (ER/PR+, HER2-)  Indication for treatment:  Curative       Radiation treatment dates:   10/06/16 - 11/19/16  Site/dose:   Left Breast treated to 50.4 Gy in 28 fractions, and then Boosted an additional 10 Gy in 5 fractions.  Beams/energy:   Left Breast : 3D  //  6X            Boost : Isodose Plan  //  6X  Narrative: The patient tolerated radiation treatment relatively well. She experienced sharp, burning pain in her left breast, which she rated 8/10 in severity. She took Advil PM to manage the pain at night while sleeping, but did not take any pain medication during the day. The patient reported fatigue throughout treatment. She experienced radiation related skin changes including radiation dermatitis of the left breast at the upper inner aspect, as well as erythema throughout the remainder of the left breast.  Plan: The patient has completed radiation treatment. The patient was given hydrogel pads to apply to the inframammary fold. Additionally, she was prescribed Percocet for pain management and to aid with sleep. The patient will return to radiation oncology clinic for routine followup in one month. I advised them to call or return sooner if they have any questions or concerns related to their recovery or treatment.  -----------------------------------  Blair Promise, PhD, MD  This document serves as a record of services personally performed by [name], MD. It was created on his behalf by Maryla Morrow, a trained medical scribe. The creation of this record is based on the scribe's personal observations and the provider's statements to them. This document has been checked and approved by the attending provider.

## 2016-11-20 NOTE — Telephone Encounter (Signed)
CALLED PATIENT TO INFORM OF FU APPT. ON 01-01-17 @ 4:35 PM WITH DR. KINARD, LVM FOR A RETURN CALL

## 2016-11-21 ENCOUNTER — Other Ambulatory Visit: Payer: Self-pay

## 2016-11-21 DIAGNOSIS — C50412 Malignant neoplasm of upper-outer quadrant of left female breast: Secondary | ICD-10-CM

## 2016-11-24 ENCOUNTER — Other Ambulatory Visit (HOSPITAL_BASED_OUTPATIENT_CLINIC_OR_DEPARTMENT_OTHER): Payer: BLUE CROSS/BLUE SHIELD

## 2016-11-24 ENCOUNTER — Ambulatory Visit (HOSPITAL_BASED_OUTPATIENT_CLINIC_OR_DEPARTMENT_OTHER): Payer: BLUE CROSS/BLUE SHIELD | Admitting: Hematology and Oncology

## 2016-11-24 ENCOUNTER — Encounter: Payer: Self-pay | Admitting: Hematology and Oncology

## 2016-11-24 VITALS — BP 165/81 | HR 89 | Temp 98.4°F | Resp 18 | Ht 69.0 in | Wt 186.3 lb

## 2016-11-24 DIAGNOSIS — Z780 Asymptomatic menopausal state: Secondary | ICD-10-CM | POA: Diagnosis not present

## 2016-11-24 DIAGNOSIS — C50412 Malignant neoplasm of upper-outer quadrant of left female breast: Secondary | ICD-10-CM

## 2016-11-24 DIAGNOSIS — Z170 Estrogen receptor positive status [ER+]: Secondary | ICD-10-CM | POA: Diagnosis not present

## 2016-11-24 LAB — COMPREHENSIVE METABOLIC PANEL
ALBUMIN: 4.1 g/dL (ref 3.5–5.0)
ALT: 20 U/L (ref 0–55)
AST: 16 U/L (ref 5–34)
Alkaline Phosphatase: 51 U/L (ref 40–150)
Anion Gap: 10 mEq/L (ref 3–11)
BILIRUBIN TOTAL: 0.51 mg/dL (ref 0.20–1.20)
BUN: 17.9 mg/dL (ref 7.0–26.0)
CALCIUM: 10.1 mg/dL (ref 8.4–10.4)
CO2: 29 mEq/L (ref 22–29)
Chloride: 102 mEq/L (ref 98–109)
Creatinine: 0.8 mg/dL (ref 0.6–1.1)
EGFR: 88 mL/min/{1.73_m2} — AB (ref >90)
GLUCOSE: 115 mg/dL (ref 70–140)
POTASSIUM: 4.2 meq/L (ref 3.5–5.1)
SODIUM: 141 meq/L (ref 136–145)
TOTAL PROTEIN: 7.4 g/dL (ref 6.4–8.3)

## 2016-11-24 LAB — CBC WITH DIFFERENTIAL/PLATELET
BASO%: 1.3 % (ref 0.0–2.0)
BASOS ABS: 0.1 10*3/uL (ref 0.0–0.1)
EOS ABS: 0.1 10*3/uL (ref 0.0–0.5)
EOS%: 1.3 % (ref 0.0–7.0)
HEMATOCRIT: 42.3 % (ref 34.8–46.6)
HEMOGLOBIN: 14.6 g/dL (ref 11.6–15.9)
LYMPH#: 1 10*3/uL (ref 0.9–3.3)
LYMPH%: 19 % (ref 14.0–49.7)
MCH: 31.4 pg (ref 25.1–34.0)
MCHC: 34.6 g/dL (ref 31.5–36.0)
MCV: 90.8 fL (ref 79.5–101.0)
MONO#: 0.3 10*3/uL (ref 0.1–0.9)
MONO%: 6.4 % (ref 0.0–14.0)
NEUT%: 72 % (ref 38.4–76.8)
NEUTROS ABS: 3.8 10*3/uL (ref 1.5–6.5)
Platelets: 212 10*3/uL (ref 145–400)
RBC: 4.66 10*6/uL (ref 3.70–5.45)
RDW: 13.2 % (ref 11.2–14.5)
WBC: 5.3 10*3/uL (ref 3.9–10.3)

## 2016-11-24 MED ORDER — LETROZOLE 2.5 MG PO TABS: 2.5000 mg | ORAL_TABLET | Freq: Every day | ORAL | 3 refills | Status: DC

## 2016-11-24 NOTE — Assessment & Plan Note (Signed)
07/23/2016: Left lumpectomy: IDC grade 1, 1 cm, with low-grade DCIS, margins negative, 0/1 lymph node negative, ER 100%, PR 100%, HER-2 negative ratio 1.58, Ki-67 10% T1 BN 0 stage IA Oncotype DX score 14: Risk of recurrence 9% Adjuvant radiation therapy 09/26/2016- 11/19/2016  Current treatment: Adjuvant antiestrogen therapy with letrozole 2.5 mg daily started 12/09/2016 Letrozole counseling:We discussed the risks and benefits of anti-estrogen therapy with aromatase inhibitors. These include but not limited to insomnia, hot flashes, mood changes, vaginal dryness, bone density loss, and weight gain. We strongly believe that the benefits far outweigh the risks. Patient understands these risks and consented to starting treatment. Planned treatment duration is 5 years.  Return to clinic in 4 months for follow-up

## 2016-11-24 NOTE — Progress Notes (Signed)
Patient Care Team: [name], MD as PCP - General (Family Medicine) [name], MD as Consulting Physician (General Surgery) [name], MD as Consulting Physician (Hematology and Oncology) [name], MD as Consulting Physician (Radiation Oncology)  DIAGNOSIS:  Encounter Diagnosis  Name Primary?  . Malignant neoplasm of upper-outer quadrant of left breast in female, estrogen receptor positive ([place])     SUMMARY OF ONCOLOGIC HISTORY:   Malignant neoplasm of upper-outer quadrant of left female breast ([place])   07/01/2016 Initial Diagnosis    Left breast biopsy 2:00 position: IDC grade 1 with DCIS, ER 100%, PR 100%, Ki-67 10%, HER-2 negative ratio 1.58; mammogram revealed upper-outer quadrant nodule in the left breast 1 x 0.8 x 0.5 cm at 2:00 axilla negative, T1 BN 0 stage IA      07/23/2016 Surgery    Left lumpectomy: IDC grade 1, 1 cm, with low-grade DCIS, margins negative, 0/1 lymph node negative, ER 100%, PR 100%, HER-2 negative ratio 1.58, Ki-67 10% T1 BN 0 stage IA       08/08/2016 Oncotype testing    Oncotype DX score 14: ROR 9%      08/22/2016 Genetic Testing    Patient has genetic testing done for personal and family history of breast cancer. BARD1 c.1409A>G and MUTYH c.1276C>G VUS found on the Breast/GYN panel.  The Breast/GYN gene panel offered by [facility] includes sequencing and rearrangement analysis for the following 23 genes:  ATM, BARD1, BRCA1, BRCA2, BRIP1, CDH1, CHEK2, EPCAM, FANCC, MLH1, MSH2, MSH6, MUTYH, NBN, NF1, PALB2, PMS2, POLD1, PTEN, RAD51C, RAD51D, RECQL, and TP53.         10/06/2016 - 11/19/2016 Radiation Therapy    Adjuvant radiation therapy       CHIEF COMPLIANT: Follow-up after radiation therapy is complete to start antiestrogen treatment  INTERVAL HISTORY: Doris HOEFER is a 56 year old with above-mentioned history of left breast cancer who underwent lumpectomy and radiation therapy and is here today to discuss starting antiestrogen  therapy. She tolerated radiation fairly well. She did have radiation dermatitis. Today she is primarily complaining of bilateral carpal tunnel syndrome symptoms with tingling and numbness. It is also causing her pain making it difficult to fall asleep at night.  REVIEW OF SYSTEMS:   Constitutional: Denies fevers, chills or abnormal weight loss Eyes: Denies blurriness of vision Ears, nose, mouth, throat, and face: Denies mucositis or sore throat Respiratory: Denies cough, dyspnea or wheezes Cardiovascular: Denies palpitation, chest discomfort Gastrointestinal:  Denies nausea, heartburn or change in bowel habits Skin: Denies abnormal skin rashes Lymphatics: Denies new lymphadenopathy or easy bruising Neurological:Denies numbness, tingling or new weaknesses Behavioral/Psych: Mood is stable, no new changes  Extremities: Bilateral carpal tunnel symptoms with some mild swelling of the hands Breast:  Completed radiation and has radiation dermatitis which is healing well If she can get a place to give blood All other systems were reviewed with the patient and are negative.  I have reviewed the past medical history, past surgical history, social history and family history with the patient and they are unchanged from previous note.  ALLERGIES:  is allergic to no known allergies.  MEDICATIONS:  Current Outpatient Prescriptions  Medication Sig Dispense Refill  . amphetamine-dextroamphetamine (ADDERALL XR) 20 MG 24 hr capsule Take 20 mg by mouth daily as needed (attention).    Marland Kitchen emollient (BIAFINE) cream Apply topically as needed.    . hyaluronate sodium (RADIAPLEXRX) GEL Apply 1 application topically 2 (two) times daily.    . Ibuprofen-Diphenhydramine HCl (ADVIL PM)  200-25 MG CAPS Take by mouth.    . oxyCODONE-acetaminophen (PERCOCET/ROXICET) 5-325 MG tablet Take 1 tablet by mouth every 4 (four) hours as needed for severe pain. 10 tablet 0   No current facility-administered medications for this  visit.     PHYSICAL EXAMINATION: ECOG PERFORMANCE STATUS: 1 - Symptomatic but completely ambulatory  There were no vitals filed for this visit. There were no vitals filed for this visit.  GENERAL:alert, no distress and comfortable SKIN: skin color, texture, turgor are normal, no rashes or significant lesions EYES: normal, Conjunctiva are pink and non-injected, sclera clear OROPHARYNX:no exudate, no erythema and lips, buccal mucosa, and tongue normal  NECK: supple, thyroid normal size, non-tender, without nodularity LYMPH:  no palpable lymphadenopathy in the cervical, axillary or inguinal LUNGS: clear to auscultation and percussion with normal breathing effort HEART: regular rate & rhythm and no murmurs and no lower extremity edema ABDOMEN:abdomen soft, non-tender and normal bowel sounds MUSCULOSKELETAL:no cyanosis of digits and no clubbing  NEURO: alert & oriented x 3 with fluent speech, no focal motor/sensory deficits EXTREMITIES: No lower extremity edema  LABORATORY DATA:  I have reviewed the data as listed   Chemistry      Component Value Date/Time   NA 138 07/17/2016 1252   NA 138 07/09/2016 1229   K 3.8 07/17/2016 1252   K 4.1 07/09/2016 1229   CL 103 07/17/2016 1252   CO2 27 07/17/2016 1252   CO2 26 07/09/2016 1229   BUN 14 07/17/2016 1252   BUN 15.7 07/09/2016 1229   CREATININE 0.76 07/17/2016 1252   CREATININE 0.8 07/09/2016 1229      Component Value Date/Time   CALCIUM 9.4 07/17/2016 1252   CALCIUM 9.6 07/09/2016 1229   ALKPHOS 46 07/09/2016 1229   AST 17 07/09/2016 1229   ALT 19 07/09/2016 1229   BILITOT 0.42 07/09/2016 1229       Lab Results  Component Value Date   WBC 5.3 11/24/2016   HGB 14.6 11/24/2016   HCT 42.3 11/24/2016   MCV 90.8 11/24/2016   PLT 212 11/24/2016   NEUTROABS 3.8 11/24/2016    ASSESSMENT & PLAN:  Malignant neoplasm of upper-outer quadrant of left female breast ([place]) 07/23/2016: Left lumpectomy: IDC grade 1, 1 cm, with  low-grade DCIS, margins negative, 0/1 lymph node negative, ER 100%, PR 100%, HER-2 negative ratio 1.58, Ki-67 10% T1 BN 0 stage IA Oncotype DX score 14: Risk of recurrence 9% Adjuvant radiation therapy 09/26/2016- 11/19/2016  Current treatment: Adjuvant antiestrogen therapy with letrozole 2.5 mg daily To start 12/2016 Letrozole counseling:We discussed the risks and benefits of anti-estrogen therapy with aromatase inhibitors. These include but not limited to insomnia, hot flashes, mood changes, vaginal dryness, bone density loss, and weight gain. We strongly believe that the benefits far outweigh the risks. Patient understands these risks and consented to starting treatment. Planned treatment duration is 5 years.  Bilateral carpal tunnel syndrome: I sent a referral to Dr. Amedeo Plenty with [facility] orthopedics.  Return to clinic in 4 months for follow-up   I spent 25 minutes talking to the patient of which more than half was spent in counseling and coordination of care.  No orders of the defined types were placed in this encounter.  The patient has a good understanding of the overall plan. she agrees with it. she will call with any problems that may develop before the next visit here.   Rulon Eisenmenger, MD 11/24/16

## 2016-12-08 ENCOUNTER — Telehealth: Payer: Self-pay

## 2016-12-08 NOTE — Telephone Encounter (Signed)
Pt wants to est care and we need her records from [place], she thinks it may have been about 10 years ago with the bulk of the visits being 1987-89. She was under [place] then-pls call pt to make appt when records received--[phone]

## 2016-12-16 ENCOUNTER — Telehealth: Payer: Self-pay | Admitting: Cardiovascular Disease

## 2016-12-16 NOTE — Telephone Encounter (Signed)
Received patient [facility] Chart from [place] for appointment on 12/23/16 with Dr Claiborne Billings.  Chart put with Dr Evette Georges schedule for 12/23/16. lp

## 2016-12-23 ENCOUNTER — Ambulatory Visit (INDEPENDENT_AMBULATORY_CARE_PROVIDER_SITE_OTHER): Payer: BLUE CROSS/BLUE SHIELD | Admitting: Cardiovascular Disease

## 2016-12-23 ENCOUNTER — Encounter: Payer: Self-pay | Admitting: Cardiovascular Disease

## 2016-12-23 VITALS — BP 144/91 | HR 80 | Ht 69.0 in | Wt 183.6 lb

## 2016-12-23 DIAGNOSIS — I472 Ventricular tachycardia: Secondary | ICD-10-CM

## 2016-12-23 DIAGNOSIS — I4729 Other ventricular tachycardia: Secondary | ICD-10-CM

## 2016-12-23 DIAGNOSIS — I10 Essential (primary) hypertension: Secondary | ICD-10-CM | POA: Diagnosis not present

## 2016-12-23 DIAGNOSIS — R002 Palpitations: Secondary | ICD-10-CM

## 2016-12-23 DIAGNOSIS — K219 Gastro-esophageal reflux disease without esophagitis: Secondary | ICD-10-CM

## 2016-12-23 DIAGNOSIS — E781 Pure hyperglyceridemia: Secondary | ICD-10-CM

## 2016-12-23 DIAGNOSIS — R0789 Other chest pain: Secondary | ICD-10-CM | POA: Diagnosis not present

## 2016-12-23 DIAGNOSIS — Z853 Personal history of malignant neoplasm of breast: Secondary | ICD-10-CM

## 2016-12-23 MED ORDER — METOPROLOL SUCCINATE ER 25 MG PO TB24: 25.0000 mg | ORAL_TABLET | Freq: Every day | ORAL | 5 refills | Status: DC

## 2016-12-23 NOTE — Progress Notes (Signed)
Cardiology Office Note    Date:  12/30/2016   ID:  Karle, Desrosier 05-Jun-1961, MRN [id number]  PCP:  [name], MD  Cardiologist:  Shelva Majestic, MD   Chief Complaint  Patient presents with  . New Patient (Initial Visit)    No Sx.     History of Present Illness:  Doris Bray is a 56 y.o. female who is a remote patient of Dr. Rollene Fare.  She has not seen him since 2007.  She presents to the office today to re-establish cardiology care.  Doris Bray has 2 children who are currently 48 and 59.  During her first pregnancy, she had an episode of nonsustained ventricular tachycardia.  She was treated with beta blockers and went through her first pregnancy with normal delivery.  During her second pregnancy, she developed recurrent episodes of sustained monomorphic ventricular tachycardia.  Dr. Rollene Fare informally consulted Dr. Bethann Humble.  During her last trimester pregnancy.  She was in the hospital on intravenous Xylocaine and high-dose beta blocker therapy.  During that time, she was reading intense Mistry novels.  It was felt that she had a hyper adrenergic response or anxiety, which tended to precipitate her arrhythmia.  She had a normal delivery.  She was treated with beta blockers for several years thereafter and was without symptoms which led ultimately to slow taper and ultimate discontinuance.  She last saw Dr. Rollene Fare in October 2007 and at that time only noted intermittent palpitations which would last 30 seconds to several minutes and then resolved.  She also was diagnosed with hypertension.  Subsequently, she has continued to do well.  She ran a marathon approximately 2 years ago.  She later developed breast cancer involving the upper outer quadrant of the left breast which was estrogen receptor positive and is on Femara.  She had received radiation and lumpectomy.  She stopped running marathons after breast cancer.  Presently, she notes occasional palpitations most  common when she is tired.  She also notes some occasional sharp pains in her chest, which is different from indigestion symptoms.  She admits to occasional hot flashes.  She has some issues with carpal tunnel.  She had laboratory in February 2018 which showed hyperlipidemia with a total cholesterol 251, HDL 84, triglycerides 140, and LDL 139, with non-HDL 167.  She denies chest pain.  She denies exertional dyspnea.  She denies difficulty with sleep.  There is no daytime sleepiness.  I calculated an Epworth Sleepiness Scale score in the office today and this endorsed at 0.  She denies presyncope or syncope.  She presents to the office today to reestablish cardiology care.   Past Medical History:  Diagnosis Date  . Bigeminy   . Family history of breast cancer   . Hematoma of breast 08/12/2016  . History of external beam radiation therapy 10/06/16-11/19/16   left breast 50.4 Gy in 28 fractions, boost 10 Gy in 5 fractions  . Malignant neoplasm of upper-outer quadrant of left female breast ([place]) 07/02/2016  . Ventricular tachycardia [facility])     Past Surgical History:  Procedure Laterality Date  . BREAST LUMPECTOMY WITH RADIOACTIVE SEED AND SENTINEL LYMPH NODE BIOPSY Left 07/23/2016   Procedure: LEFT BREAST LUMPECTOMY WITH RADIOACTIVE SEED AND SENTINEL LYMPH NODE BIOPSY, INJECT BLUE DYE LEFT BREAST;  Surgeon: [name], MD;  Location: [facility];  Service: General;  Laterality: Left;  . CESAREAN SECTION     x2  . COLONOSCOPY    . EVACUATION BREAST  HEMATOMA Left 08/12/2016   Procedure: EVACUATION HEMATOMA LEFT BREAST;  Surgeon: [name], MD;  Location: [facility];  Service: General;  Laterality: Left;    Current Medications: Outpatient Medications Prior to Visit  Medication Sig Dispense Refill  . amphetamine-dextroamphetamine (ADDERALL XR) 20 MG 24 hr capsule Take 20 mg by mouth daily as needed (attention).    Marland Kitchen emollient (BIAFINE) cream Apply topically as needed.    . hyaluronate sodium  (RADIAPLEXRX) GEL Apply 1 application topically 2 (two) times daily.    . Ibuprofen-Diphenhydramine HCl (ADVIL PM) 200-25 MG CAPS Take by mouth.    . letrozole (FEMARA) 2.5 MG tablet Take 1 tablet (2.5 mg total) by mouth daily. 90 tablet 3   No facility-administered medications prior to visit.      Allergies:   No known allergies   Social History   Social History  . Marital status: Divorced    Spouse name: N/A  . Number of children: 2  . Years of education: N/A   Social History Main Topics  . Smoking status: Never Smoker  . Smokeless tobacco: Never Used  . Alcohol use 0.0 oz/week     Comment: 4 week  . Drug use: No  . Sexual activity: Not Asked   Other Topics Concern  . None   Social History Narrative  . None    Additional social history is notable in that she is a Designer, jewellery.  Patient worked for [employer], which is now [place].  She is divorced and is remarried for 10 years.  She exercises intermittently and runs.  She does drink occasional alcohol.  Wine.  There is no history of tobacco use.  Family History:  The patient's  family history includes Breast cancer (age of onset: 32) in her mother.  Her mother died at age 31 and had dementia.  Her father died at 12 and had a stroke.  She has one sister age 39.  She has 2 children who are now 43 and 30.  ROS General: Negative; No fevers, chills, or night sweats;  HEENT: Negative; No changes in vision or hearing, sinus congestion, difficulty swallowing Pulmonary: Negative; No cough, wheezing, shortness of breath, hemoptysis Cardiovascular: History of VT, see history of present illness GI: Negative; No nausea, vomiting, diarrhea, or abdominal pain GU: Negative; No dysuria, hematuria, or difficulty voiding Musculoskeletal: Negative; no myalgias, joint pain, or weakness Hematologic/Oncology: Positive for breast cancer Endocrine: Negative; no heat/cold intolerance; no diabetes Neuro: Negative; no changes in  balance, headaches Skin: Negative; No rashes or skin lesions Psychiatric: Negative; No behavioral problems, depression Sleep: Negative; No snoring, daytime sleepiness, hypersomnolence, bruxism, restless legs, hypnogognic hallucinations, no cataplexy Other comprehensive 14 point system review is negative.   PHYSICAL EXAM:   VS:  BP (!) 144/91   Pulse 80   Ht '5\' 9"'$  (1.753 m)   Wt 183 lb 9.6 oz (83.3 kg)   BMI 27.11 kg/m     Repeat blood pressure by me was 144/90.  Wt Readings from Last 3 Encounters:  12/23/16 183 lb 9.6 oz (83.3 kg)  11/24/16 186 lb 4.8 oz (84.5 kg)  11/11/16 186 lb 6.4 oz (84.6 kg)    General: Alert, oriented, no distress.  Skin: normal turgor, no rashes, warm and dry HEENT: Normocephalic, atraumatic. Pupils equal round and reactive to light; sclera anicteric; extraocular muscles intact; Fundi ** Nose without nasal septal hypertrophy Mouth/Parynx benign; Mallinpatti scale Neck: No JVD, no carotid bruits; normal carotid upstroke Lungs: clear to  ausculatation and percussion; no wheezing or rales Chest wall: without tenderness to palpitation Heart: PMI not displaced, RRR, s1 s2 normal, 1/6 systolic murmur, no diastolic murmur, no rubs, gallops, thrills, or heaves Abdomen: soft, nontender; no hepatosplenomehaly, BS+; abdominal aorta nontender and not dilated by palpation. Back: no CVA tenderness Pulses 2+ Musculoskeletal: full range of motion, normal strength, no joint deformities Extremities: no clubbing cyanosis or edema, Homan's sign negative  Neurologic: grossly nonfocal; Cranial nerves grossly wnl Psychologic: Normal mood and affect   Studies/Labs Reviewed:   EKG:  EKG is ordered today.  ECG (independently read by me): Normal sinus rhythm at 80 bpm.  No ST segment changes.  Normal intervals.  I personally and independently reviewed her last ECG from Dr.Weintraub in 05/19/2006.  This shows normal sinus rhythm at 90 bpm.  PR interval 142 ms, QRS duration 78  ms.  Recent Labs: BMP Latest Ref Rng & Units 11/24/2016 07/17/2016 07/09/2016  Glucose 70 - 140 mg/dl 115 134(H) 112  BUN 7.0 - 26.0 mg/dL 17.9 14 15.7  Creatinine 0.6 - 1.1 mg/dL 0.8 0.76 0.8  Sodium 136 - 145 mEq/L 141 138 138  Potassium 3.5 - 5.1 mEq/L 4.2 3.8 4.1  Chloride 101 - 111 mmol/L - 103 -  CO2 22 - 29 mEq/L '29 27 26  '$ Calcium 8.4 - 10.4 mg/dL 10.1 9.4 9.6     Hepatic Function Latest Ref Rng & Units 11/24/2016 07/09/2016  Total Protein 6.4 - 8.3 g/dL 7.4 7.1  Albumin 3.5 - 5.0 g/dL 4.1 3.8  AST 5 - 34 U/L 16 17  ALT 0 - 55 U/L 20 19  Alk Phosphatase 40 - 150 U/L 51 46  Total Bilirubin 0.20 - 1.20 mg/dL 0.51 0.42    CBC Latest Ref Rng & Units 11/24/2016 08/13/2016 07/17/2016  WBC 3.9 - 10.3 10e3/uL 5.3 4.2 6.7  Hemoglobin 11.6 - 15.9 g/dL 14.6 11.4(L) 13.7  Hematocrit 34.8 - 46.6 % 42.3 34.2(L) 39.3  Platelets 145 - 400 10e3/uL 212 196 227   Lab Results  Component Value Date   MCV 90.8 11/24/2016   MCV 95.5 08/13/2016   MCV 92.9 07/17/2016   No results found for: TSH No results found for: HGBA1C   BNP No results found for: BNP  ProBNP No results found for: PROBNP   Lipid Panel  No results found for: CHOL, TRIG, HDL, CHOLHDL, VLDL, LDLCALC, LDLDIRECT   RADIOLOGY: No results found.   Additional studies/ records that were reviewed today include:  I reviewed Dr. Lowella Fairy records.  She had a nuclear perfusion study in November 2017 which showed normal perfusion.  Her last Holter monitor study in 2007 showed sinus rhythm with episodes of sinus tachycardia and PVCs and she had a short atrial run.    ASSESSMENT:    1. Pure hyperglyceridemia   2. Essential hypertension   3. VENTRICULAR TACHYCARDIA   4. Palpitations   5. Gastroesophageal reflux disease without esophagitis   6. History of breast cancer   7. Atypical chest pain      PLAN:  Doris Bray is a 56 year old female who has a remote history of nonsustained VT during her first  pregnancy and sustained monomorphic VT during her second pregnancy.  At the time, it was felt that she had a hyperadrenergic response, contributing to her arrhythmia in the setting of her pregnancy state.  She did not have any preeclampsia, but had mild hypertension.  An echo Doppler study at that time reportedly did not show any  structural heart disease.  She had had intermittent palpitations.  Remotely, she was started on amlodipine, valsartan, hypertension, and also was on simvastatin for hyperlipidemia.  Presently, she is not on any cardiac medications.  She is on Femara for her breast CA.  She has experienced occasional palpitations described as skipped beats, occurring predominantly  when tired.  She also notes occasional sharp, nonexertional chest pain which is short-lived and different from her prior indigestion.  This does not appear to be ischemic in etiology.  She had a normal nuclear perfusion study in 2007 which I personally reviewed.  Presently, I am recommending that she undergo a 2-D echo Doppler study for further evaluation.  Her blood pressure today was elevated at 144/91, and with her history of palpitations and mild blood pressure elevation  I am recommending resumption of low-dose beta blocker therapy with Toprol-XL initially at 25 mg.  If she continues to experience palpitations this will be increased to 50 mg.  I am scheduling her to undergo a complete set of laboratory.  I suspect she will require reinitiation of lipid lowering therapy.  In the past she had been on Protonix for GERD.  I will see her in 2 months for reevaluation and further recommendations will be made at that time.   Medication Adjustments/Labs and Tests Ordered: Current medicines are reviewed at length with the patient today.  Concerns regarding medicines are outlined above.  Medication changes, Labs and Tests ordered today are listed in the Patient Instructions below. Patient Instructions  Medication Instructions:    START METOPROLOL SUCC (TOPROL) 25 MG DAILY   Labwork: FASTING LP/CMET/CBC/TSH AT [facility] ON THE FIRST FLOOR SOON   Testing/Procedures: Your physician has requested that you have an echocardiogram. Echocardiography is a painless test that uses sound waves to create images of your heart. It provides your doctor with information about the size and shape of your heart and how well your heart's chambers and valves are working. This procedure takes approximately one hour. There are no restrictions for this procedure. [place] [address]  Follow-Up: Your physician recommends that you schedule a follow-up appointment in: 2 MONTH OV  If you need a refill on your cardiac medications before your next appointment, please call your pharmacy.     Signed, Shelva Majestic, MD  12/30/2016 8:29 AM    [facility] [address], [place], [place],   [address] Phone: [phone]

## 2016-12-23 NOTE — Patient Instructions (Addendum)
Medication Instructions:  START METOPROLOL SUCC (TOPROL) 25 MG DAILY   Labwork: FASTING LP/CMET/CBC/TSH AT [facility] ON THE FIRST FLOOR SOON   Testing/Procedures: Your physician has requested that you have an echocardiogram. Echocardiography is a painless test that uses sound waves to create images of your heart. It provides your doctor with information about the size and shape of your heart and how well your heart's chambers and valves are working. This procedure takes approximately one hour. There are no restrictions for this procedure. [place] [address]  Follow-Up: Your physician recommends that you schedule a follow-up appointment in: 2 MONTH OV  If you need a refill on your cardiac medications before your next appointment, please call your pharmacy.

## 2016-12-29 ENCOUNTER — Encounter: Payer: Self-pay | Admitting: Oncology

## 2017-01-01 ENCOUNTER — Encounter: Payer: Self-pay | Admitting: Radiation Oncology

## 2017-01-01 ENCOUNTER — Ambulatory Visit
Admission: RE | Admit: 2017-01-01 | Discharge: 2017-01-01 | Disposition: A | Discharge status: Home / Self Care | Payer: BLUE CROSS/BLUE SHIELD | Source: Ambulatory Visit | Attending: Radiation Oncology | Admitting: Radiation Oncology

## 2017-01-01 DIAGNOSIS — Z170 Estrogen receptor positive status [ER+]: Secondary | ICD-10-CM

## 2017-01-01 DIAGNOSIS — C50412 Malignant neoplasm of upper-outer quadrant of left female breast: Secondary | ICD-10-CM | POA: Diagnosis not present

## 2017-01-01 NOTE — Progress Notes (Signed)
Doris Bray is here for follow up.  She denies having pain.  She is having carpal tunnel issues and will be seeing orthospedic doctor.  She is taking Femara and is having a lot of hot flashes.  She reports having fatigue.  The skin on her left breast has hyperpigmentation.  BP (!) 152/86 (BP Location: Right Arm, Patient Position: Sitting)   Pulse 86   Temp 98.7 F (37.1 C) (Oral)   Ht 5\' 9"  (1.753 m)   Wt 184 lb (83.5 kg)   SpO2 99%   BMI 27.17 kg/m    Wt Readings from Last 3 Encounters:  01/01/17 184 lb (83.5 kg)  12/23/16 183 lb 9.6 oz (83.3 kg)  11/24/16 186 lb 4.8 oz (84.5 kg)

## 2017-01-01 NOTE — Progress Notes (Signed)
  Radiation Oncology         ([phone]) [phone] ________________________________  Name: Doris Bray MRN: [id number]  Date: 01/01/2017  DOB: [birth year]  Follow-Up Visit Note  CC: [name], MD  [name], MD    ICD-9-CM ICD-10-CM   1. Malignant neoplasm of upper-outer quadrant of left breast in female, estrogen receptor positive ([place]) 174.4 C50.412    V86.0 Z17.0     Diagnosis:   Clinical stage [id number] grade 1 IDC and DCIS of the left breast (ER/PR+  Interval Since Last Radiation:  6 weeks  Narrative:  The patient returns today for routine follow-up.  She denies having pain.  She is having carpal tunnel issues and will be seeing orthopedic doctor in July. She endorses neuropathy in right hand and left thumb. She is taking Femara and is having a lot of hot flashes. Patient has been on this for 3-4 weeks. She reports fatigue. She endorses mild, intermittent shooting pains in her breast but is not too bothered by them (maybe 2-3 episodes since treatment).                             ALLERGIES:  is allergic to no known allergies.  Meds: Current Outpatient Prescriptions  Medication Sig Dispense Refill  . amphetamine-dextroamphetamine (ADDERALL XR) 20 MG 24 hr capsule Take 20 mg by mouth daily as needed (attention).    . Ibuprofen-Diphenhydramine HCl (ADVIL PM) 200-25 MG CAPS Take by mouth.    . letrozole (FEMARA) 2.5 MG tablet Take 1 tablet (2.5 mg total) by mouth daily. 90 tablet 3  . metoprolol succinate (TOPROL XL) 25 MG 24 hr tablet Take 1 tablet (25 mg total) by mouth daily. (Patient not taking: Reported on 01/01/2017) 30 tablet 5   No current facility-administered medications for this encounter.     Physical Findings: The patient is in no acute distress. Patient is alert and oriented.  height is 5\' 9"  (1.753 m) and weight is 184 lb (83.5 kg). Her oral temperature is 98.7 F (37.1 C). Her blood pressure is 152/86 (abnormal) and her pulse is 86. Her oxygen saturation is  99%. .  Lungs are clear to auscultation bilaterally. Heart has regular rate and rhythm. No palpable cervical, supraclavicular, or axillary adenopathy. Abdomen soft, non-tender, normal bowel sounds. Left breast: Some mild hyperpigmentation changes were noted. Also appreciated was some mild edema in the nipple-areolar complex area. No dominant mass, nipple discharge, or bleeding. Right breast: No palpable masses or nipple discharge.   Lab Findings: Lab Results  Component Value Date   WBC 5.3 11/24/2016   HGB 14.6 11/24/2016   HCT 42.3 11/24/2016   MCV 90.8 11/24/2016   PLT 212 11/24/2016    Radiographic Findings: No results found.  Impression:  The patient is recovering from the effects of radiation. No signs of recurrence on clinical exam.  Plan:  Prn f/u with radiation oncology. Patient will continue with f/u with surgery and medical oncology. She will continue on Femara.  -----------------------------------  Blair Promise, PhD, MD  This document serves as a record of services personally performed by [name], MD. It was created on his behalf by Linward Natal, a trained medical scribe. The creation of this record is based on the scribe's personal observations and the provider's statements to them. This document has been checked and approved by the attending provider.

## 2017-01-09 ENCOUNTER — Other Ambulatory Visit (HOSPITAL_COMMUNITY): Payer: BLUE CROSS/BLUE SHIELD

## 2017-01-09 ENCOUNTER — Telehealth (HOSPITAL_COMMUNITY): Payer: Self-pay | Admitting: Cardiovascular Disease

## 2017-01-09 NOTE — Telephone Encounter (Signed)
01/09/2017 08:13 AM Phone (Outgoing) Coralynn, Gaona (Self) [phone] (H)   Left Message - Called pt and lmsg for her to CB to get r/sed for echo due to tech being out.     By Verdene Rio

## 2017-01-20 ENCOUNTER — Ambulatory Visit (HOSPITAL_COMMUNITY): Payer: BLUE CROSS/BLUE SHIELD | Attending: Cardiology

## 2017-01-20 ENCOUNTER — Other Ambulatory Visit: Payer: Self-pay

## 2017-01-20 DIAGNOSIS — I071 Rheumatic tricuspid insufficiency: Secondary | ICD-10-CM | POA: Insufficient documentation

## 2017-01-20 DIAGNOSIS — E785 Hyperlipidemia, unspecified: Secondary | ICD-10-CM | POA: Diagnosis not present

## 2017-01-20 DIAGNOSIS — I4729 Other ventricular tachycardia: Secondary | ICD-10-CM

## 2017-01-20 DIAGNOSIS — I472 Ventricular tachycardia: Secondary | ICD-10-CM | POA: Insufficient documentation

## 2017-01-20 DIAGNOSIS — I10 Essential (primary) hypertension: Secondary | ICD-10-CM | POA: Insufficient documentation

## 2017-01-22 NOTE — Progress Notes (Signed)
CLINIC:  Survivorship   REASON FOR VISIT:  Routine follow-up post-treatment for a recent history of breast cancer.  BRIEF ONCOLOGIC HISTORY:    Malignant neoplasm of upper-outer quadrant of left female breast ([place])   07/01/2016 Initial Diagnosis    Left breast biopsy 2:00 position: IDC grade 1 with DCIS, ER 100%, PR 100%, Ki-67 10%, HER-2 negative ratio 1.58; mammogram revealed upper-outer quadrant nodule in the left breast 1 x 0.8 x 0.5 cm at 2:00 axilla negative, T1 BN 0 stage IA      07/23/2016 Surgery    Left lumpectomy [name]): IDC grade 1, 1 cm, with low-grade DCIS, margins negative, 0/1 lymph node negative, ER 100%, PR 100%, HER-2 negative ratio 1.58, Ki-67 10% T1 BN 0 stage IA       08/08/2016 Oncotype testing    Oncotype DX score 14: ROR 9%      08/22/2016 Genetic Testing    Patient has genetic testing done for personal and family history of breast cancer. BARD1 c.1409A>G and MUTYH c.1276C>G VUS found on the Breast/GYN panel.  The Breast/GYN gene panel offered by [facility] includes sequencing and rearrangement analysis for the following 23 genes:  ATM, BARD1, BRCA1, BRCA2, BRIP1, CDH1, CHEK2, EPCAM, FANCC, MLH1, MSH2, MSH6, MUTYH, NBN, NF1, PALB2, PMS2, POLD1, PTEN, RAD51C, RAD51D, RECQL, and TP53.         10/06/2016 - 11/19/2016 Radiation Therapy    Adjuvant radiation therapy ([place]): Left Breast treated to 50.4 Gy in 28 fractions, and then Boosted an additional 10 Gy in 5 fractions.      11/2016 -  Anti-estrogen oral therapy    Letrozole daily       Genetic Testing    Patient has genetic testing done for a personal and family history of breat cancer. The Breast/GYN gene panel offered by [facility] includes sequencing and rearrangement analysis for the following 23 genes:  ATM, BARD1, BRCA1, BRCA2, BRIP1, CDH1, CHEK2, EPCAM, FANCC, MLH1, MSH2, MSH6, MUTYH, NBN, NF1, PALB2, PMS2, POLD1, PTEN, RAD51C, RAD51D, RECQL, and TP53.    Results revealed patient has the following  mutation(s):  Negative for pathogenic variants in the 23 genes tested.  Two variants of uncertain significance identified. BARD1 c.1409A>G and MUTYH c.1276C>T VUS The report date is 08/22/2016.   Amendment: The date of this amended report is January 21, 2017.  Based on the ACMG standards and guidelines for the interpretation of sequence variants (Richards 2015) that utilizes a combination of sources, e.g., internal data, published literature, population databases and in silico models, the MUTYH, c.1276C>T (p.Arg426Cys) VUS has been reclassified to Likely Benign.  The classification of the  BARD1 c.1409A>G VUS has not been changed.          INTERVAL HISTORY:  Doris Bray presents to the Survivorship Clinic today for our initial meeting to review her survivorship care plan detailing her treatment course for breast cancer, as well as monitoring long-term side effects of that treatment, education regarding health maintenance, screening, and overall wellness and health promotion.     Overall, [name] reports feeling quite well.  Her main issue Is regarding her carpal tunnel and issues relating to that.  She does have appt with ortho upcoming for a full evaluation.  She is taking Letrozole daily and is tolerating it well.  She enjoys running and is getting back into doing that as well.  She is experiencing hot flashes, and cannot sleep because of them.  She is very distressed due to this, and is wondering  if some medication would perhaps help.      REVIEW OF SYSTEMS:  Review of Systems  Constitutional: Negative for appetite change, chills, diaphoresis, fatigue, fever and unexpected weight change.  HENT:   Negative for hearing loss and lump/mass.   Eyes: Negative for eye problems and icterus.  Respiratory: Negative for chest tightness, cough and shortness of breath.   Cardiovascular: Negative for chest pain, leg swelling and palpitations.  Gastrointestinal: Negative for abdominal distention and  abdominal pain.  Endocrine: Positive for hot flashes.  Genitourinary: Negative for difficulty urinating.   Musculoskeletal: Negative for arthralgias.  Skin: Negative for itching and rash.  Neurological: Negative for dizziness, extremity weakness and headaches.  Hematological: Negative for adenopathy. Does not bruise/bleed easily.  Psychiatric/Behavioral: Negative for depression. The patient is not nervous/anxious.    Breast: Denies any new nodularity, masses, tenderness, nipple changes, or nipple discharge.      ONCOLOGY TREATMENT TEAM:  1. Surgeon:  Dr. [name] at [facility] 2. Medical Oncologist: Dr. Lindi Adie  3. Radiation Oncologist: Dr. Sondra Come    PAST MEDICAL/SURGICAL HISTORY:  Past Medical History:  Diagnosis Date  . Bigeminy   . Family history of breast cancer   . Hematoma of breast 08/12/2016  . History of external beam radiation therapy 10/06/16-11/19/16   left breast 50.4 Gy in 28 fractions, boost 10 Gy in 5 fractions  . Malignant neoplasm of upper-outer quadrant of left female breast ([place]) 07/02/2016  . Ventricular tachycardia [facility])    Past Surgical History:  Procedure Laterality Date  . BREAST LUMPECTOMY WITH RADIOACTIVE SEED AND SENTINEL LYMPH NODE BIOPSY Left 07/23/2016   Procedure: LEFT BREAST LUMPECTOMY WITH RADIOACTIVE SEED AND SENTINEL LYMPH NODE BIOPSY, INJECT BLUE DYE LEFT BREAST;  Surgeon: [name], MD;  Location: [facility];  Service: General;  Laterality: Left;  . CESAREAN SECTION     x2  . COLONOSCOPY    . EVACUATION BREAST HEMATOMA Left 08/12/2016   Procedure: EVACUATION HEMATOMA LEFT BREAST;  Surgeon: [name], MD;  Location: [facility];  Service: General;  Laterality: Left;     ALLERGIES:  Allergies  Allergen Reactions  . No Known Allergies      CURRENT MEDICATIONS:  Outpatient Encounter Prescriptions as of 01/23/2017  Medication Sig  . amphetamine-dextroamphetamine (ADDERALL XR) 20 MG 24 hr capsule Take 20 mg by mouth daily as  needed (attention).  . Ibuprofen-Diphenhydramine HCl (ADVIL PM) 200-25 MG CAPS Take by mouth.  . letrozole (FEMARA) 2.5 MG tablet Take 1 tablet (2.5 mg total) by mouth daily.  . metoprolol succinate (TOPROL XL) 25 MG 24 hr tablet Take 1 tablet (25 mg total) by mouth daily.  Marland Kitchen gabapentin (NEURONTIN) 100 MG capsule Take 1 capsule (100 mg total) by mouth at bedtime.   No facility-administered encounter medications on file as of 01/23/2017.      ONCOLOGIC FAMILY HISTORY:  Family History  Problem Relation Age of Onset  . Breast cancer Mother 63       SOCIAL HISTORY:  BRET STAMOUR is married and lives with her husband in [place], [place].  She has 2 children ages 38 and 62 and they live in [place] and [place].  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  She denies any current or history of tobacco, alcohol, or illicit drug use.     PHYSICAL EXAMINATION:  Vital Signs:   Vitals:   01/23/17 1348  BP: (!) 154/78  Pulse: 79  Resp: 18  Temp: 98.4 F (36.9 C)  Filed Weights   01/23/17 1348  Weight: 184 lb (83.5 kg)   General: Well-nourished, well-appearing female in no acute distress.  She is unaccompanied today.   HEENT: Head is normocephalic.  Pupils equal and reactive to light. Conjunctivae clear without exudate.  Sclerae anicteric. Oral mucosa is pink, moist.  Oropharynx is pink without lesions or erythema.  Lymph: No cervical, supraclavicular, or infraclavicular lymphadenopathy noted on palpation.  Cardiovascular: Regular rate and rhythm.Marland Kitchen Respiratory: Clear to auscultation bilaterally. Chest expansion symmetric; breathing non-labored.  GI: Abdomen soft and round; non-tender, non-distended. Bowel sounds normoactive.  GU: Deferred.  Neuro: No focal deficits. Steady gait.  Psych: Mood and affect normal and appropriate for situation.  Extremities: No edema. MSK: No focal spinal tenderness to palpation.  Full range of motion in bilateral upper  extremities Skin: Warm and dry.  LABORATORY DATA:  None for this visit.  DIAGNOSTIC IMAGING:  None for this visit.      ASSESSMENT AND PLAN:  Ms.. Salmon is a pleasant 56 y.o. female with Stage IA left breast invasive ductal carcinoma, ER+/PR+/HER2-, diagnosed in 06/2016, treated with lumpectomy, adjuvant radiation therapy, and anti-estrogen therapy with Letrozole beginning in 11/2016.  She presents to the Survivorship Clinic for our initial meeting and routine follow-up post-completion of treatment for breast cancer.    1. Stage IA left breast cancer:  Ms. Speas is continuing to recover from definitive treatment for breast cancer. She will follow-up with her medical oncologist, Dr. Lindi Adie in August, 2018 with history and physical exam per surveillance protocol.  She will continue her anti-estrogen therapy with Letrozole. Thus far, she is tolerating the Letrozole well, with minimal side effects. She was instructed to make Dr. Lindi Adie or myself aware if she begins to experience any worsening side effects of the medication and I could see her back in clinic to help manage those side effects, as needed.  Today, a comprehensive survivorship care plan and treatment summary was reviewed with the patient today detailing her breast cancer diagnosis, treatment course, potential late/long-term effects of treatment, appropriate follow-up care with recommendations for the future, and patient education resources.  A copy of this summary, along with a letter will be sent to the patient's primary care provider via mail/fax/In Basket message after today's visit.    2. Carpal Tunnel issues: ? Relation to Letrozole.  She has however had this many years prior to her breast cancer diagnosis.  Will await ortho full eval and recommendations.  3. Hot flashes: Will start Gabapentin '300mg'$  QHS for her hot flashes.  We discussed other non-pharmacologic interventions for her hot flashes in addition to Effexor, which may be  needed in the future.    4. Bone health:  Given Ms. Koelzer age/history of breast cancer and her current treatment regimen including anti-estrogen therapy with Letrozole, she is at risk for bone demineralization.  She is due for DEXA.  This will be schedule with her mammogram.  In the meantime, she was encouraged to increase her consumption of foods rich in calcium, as well as increase her weight-bearing activities.  She was given education on specific activities to promote bone health.  5. Cancer screening:  Due to Ms. Esguerra history and her age, she should receive screening for skin cancers, colon cancer, and gynecologic cancers.  The information and recommendations are listed on the patient's comprehensive care plan/treatment summary and were reviewed in detail with the patient.    6. Health maintenance and wellness promotion: Ms. Lafave was encouraged to consume 5-7  servings of fruits and vegetables per day. We reviewed the "Nutrition Rainbow" handout, as well as the handout "Take Control of Your Health and Reduce Your Cancer Risk" from the [employer].  She was also encouraged to engage in moderate to vigorous exercise for 30 minutes per day most days of the week. We discussed the [employer] fitness program, which is designed for cancer survivors to help them become more physically fit after cancer treatments.  She was instructed to limit her alcohol consumption and continue to abstain from tobacco use.     7. Support services/counseling: It is not uncommon for this period of the patient's cancer care trajectory to be one of many emotions and stressors.  We discussed an opportunity for her to participate in the next session of [facility] ("Finding Your New Normal") support group series designed for patients after they have completed treatment.   Ms. Amoroso was encouraged to take advantage of our many other support services programs, support groups, and/or counseling in coping with her new  life as a cancer survivor after completing anti-cancer treatment.  She was offered support today through active listening and expressive supportive counseling.  She was given information regarding our available services and encouraged to contact me with any questions or for help enrolling in any of our support group/programs.    Dispo:   Suggested follow up:  -Dr. Lindi Adie in 03/2017, 09/2017 (then recurring every February) -Dr. [name] in 02/2017, 03/2018 (recurring every August) -Mammogram and bone density this month when due -She is welcome to return back to the Survivorship Clinic at any time; no additional follow-up needed at this time.  -Consider referral back to survivorship as a long-term survivor for continued surveillance  A total of (30) minutes of face-to-face time was spent with this patient with greater than 50% of that time in counseling and care-coordination.   Gardenia Phlegm, [place] [phone]   Note: PRIMARY CARE PROVIDER [name], [place] [phone]

## 2017-01-23 ENCOUNTER — Telehealth: Payer: Self-pay | Admitting: Adult Health

## 2017-01-23 ENCOUNTER — Ambulatory Visit (HOSPITAL_BASED_OUTPATIENT_CLINIC_OR_DEPARTMENT_OTHER): Payer: BLUE CROSS/BLUE SHIELD | Admitting: Adult Health

## 2017-01-23 ENCOUNTER — Encounter: Payer: Self-pay | Admitting: Adult Health

## 2017-01-23 ENCOUNTER — Other Ambulatory Visit: Payer: Self-pay | Admitting: Adult Health

## 2017-01-23 VITALS — BP 154/78 | HR 79 | Temp 98.4°F | Resp 18 | Ht 69.0 in | Wt 184.0 lb

## 2017-01-23 DIAGNOSIS — E2839 Other primary ovarian failure: Secondary | ICD-10-CM

## 2017-01-23 DIAGNOSIS — R232 Flushing: Secondary | ICD-10-CM | POA: Diagnosis not present

## 2017-01-23 DIAGNOSIS — Z170 Estrogen receptor positive status [ER+]: Secondary | ICD-10-CM

## 2017-01-23 DIAGNOSIS — C50412 Malignant neoplasm of upper-outer quadrant of left female breast: Secondary | ICD-10-CM | POA: Diagnosis not present

## 2017-01-23 MED ORDER — GABAPENTIN 100 MG PO CAPS: 100.0000 mg | ORAL_CAPSULE | Freq: Every day | ORAL | 0 refills | Status: DC

## 2017-01-23 NOTE — Progress Notes (Signed)
Amendment: The date of this amended report is January 21, 2017.  Based on the ACMG standards and guidelines for the interpretation of sequence variants (Richards 2015) that utilizes a combination of sources, e.g., internal data, published literature, population databases and in silico models, the MUTYH, c.1276C>T (p.Arg426Cys) VUS has been reclassified to Likely Benign.  The classification of the  BARD1 c.1409A>G VUS has not been changed.

## 2017-01-23 NOTE — Telephone Encounter (Signed)
Called [facility] to schedule appt for bone density and mammogram for patient.  Appt made for 02/05/17 8:10am. Spoke with patient around 3:20 pm regarding time and date of appt and she said it would be ok.

## 2017-01-23 NOTE — Patient Instructions (Signed)
Gabapentin capsules or tablets What is this medicine? GABAPENTIN (GA ba pen tin) is used to control partial seizures in adults with epilepsy. It is also used to treat certain types of nerve pain. This medicine may be used for other purposes; ask your health care provider or pharmacist if you have questions. COMMON BRAND NAME(S): Active-PAC with Gabapentin, Gabarone, Neurontin What should I tell my health care provider before I take this medicine? They need to know if you have any of these conditions: -kidney disease -suicidal thoughts, plans, or attempt; a previous suicide attempt by you or a family member -an unusual or allergic reaction to gabapentin, other medicines, foods, dyes, or preservatives -pregnant or trying to get pregnant -breast-feeding How should I use this medicine? Take this medicine by mouth with a glass of water. Follow the directions on the prescription label. You can take it with or without food. If it upsets your stomach, take it with food.Take your medicine at regular intervals. Do not take it more often than directed. Do not stop taking except on your doctor's advice. If you are directed to break the 600 or 800 mg tablets in half as part of your dose, the extra half tablet should be used for the next dose. If you have not used the extra half tablet within 28 days, it should be thrown away. A special MedGuide will be given to you by the pharmacist with each prescription and refill. Be sure to read this information carefully each time. Talk to your pediatrician regarding the use of this medicine in children. Special care may be needed. Overdosage: If you think you have taken too much of this medicine contact a poison control center or emergency room at once. NOTE: This medicine is only for you. Do not share this medicine with others. What if I miss a dose? If you miss a dose, take it as soon as you can. If it is almost time for your next dose, take only that dose. Do not  take double or extra doses. What may interact with this medicine? Do not take this medicine with any of the following medications: -other gabapentin products This medicine may also interact with the following medications: -alcohol -antacids -antihistamines for allergy, cough and cold -certain medicines for anxiety or sleep -certain medicines for depression or psychotic disturbances -homatropine; hydrocodone -naproxen -narcotic medicines (opiates) for pain -phenothiazines like chlorpromazine, mesoridazine, prochlorperazine, thioridazine This list may not describe all possible interactions. Give your health care provider a list of all the medicines, herbs, non-prescription drugs, or dietary supplements you use. Also tell them if you smoke, drink alcohol, or use illegal drugs. Some items may interact with your medicine. What should I watch for while using this medicine? Visit your doctor or health care professional for regular checks on your progress. You may want to keep a record at home of how you feel your condition is responding to treatment. You may want to share this information with your doctor or health care professional at each visit. You should contact your doctor or health care professional if your seizures get worse or if you have any new types of seizures. Do not stop taking this medicine or any of your seizure medicines unless instructed by your doctor or health care professional. Stopping your medicine suddenly can increase your seizures or their severity. Wear a medical identification bracelet or chain if you are taking this medicine for seizures, and carry a card that lists all your medications. You may get drowsy, dizzy,   or have blurred vision. Do not drive, use machinery, or do anything that needs mental alertness until you know how this medicine affects you. To reduce dizzy or fainting spells, do not sit or stand up quickly, especially if you are an older patient. Alcohol can  increase drowsiness and dizziness. Avoid alcoholic drinks. Your mouth may get dry. Chewing sugarless gum or sucking hard candy, and drinking plenty of water will help. The use of this medicine may increase the chance of suicidal thoughts or actions. Pay special attention to how you are responding while on this medicine. Any worsening of mood, or thoughts of suicide or dying should be reported to your health care professional right away. Women who become pregnant while using this medicine may enroll in the [place] American Antiepileptic Drug Pregnancy Registry by calling 1-888-233-2334. This registry collects information about the safety of antiepileptic drug use during pregnancy. What side effects may I notice from receiving this medicine? Side effects that you should report to your doctor or health care professional as soon as possible: -allergic reactions like skin rash, itching or hives, swelling of the face, lips, or tongue -worsening of mood, thoughts or actions of suicide or dying Side effects that usually do not require medical attention (report to your doctor or health care professional if they continue or are bothersome): -constipation -difficulty walking or controlling muscle movements -dizziness -nausea -slurred speech -tiredness -tremors -weight gain This list may not describe all possible side effects. Call your doctor for medical advice about side effects. You may report side effects to FDA at 1-800-FDA-1088. Where should I keep my medicine? Keep out of reach of children. This medicine may cause accidental overdose and death if it taken by other adults, children, or pets. Mix any unused medicine with a substance like cat litter or coffee grounds. Then throw the medicine away in a sealed container like a sealed bag or a coffee can with a lid. Do not use the medicine after the expiration date. Store at room temperature between 15 and 30 degrees C (59 and 86 degrees F). NOTE: This  sheet is a summary. It may not cover all possible information. If you have questions about this medicine, talk to your doctor, pharmacist, or health care provider.  2018 Elsevier/Gold Standard (2013-09-23 15:26:50)  

## 2017-01-28 ENCOUNTER — Other Ambulatory Visit: Payer: BLUE CROSS/BLUE SHIELD

## 2017-02-05 ENCOUNTER — Other Ambulatory Visit: Payer: BLUE CROSS/BLUE SHIELD

## 2017-02-09 ENCOUNTER — Ambulatory Visit
Admission: RE | Admit: 2017-02-09 | Discharge: 2017-02-09 | Disposition: A | Discharge status: Home / Self Care | Payer: BLUE CROSS/BLUE SHIELD | Source: Ambulatory Visit | Attending: Adult Health | Admitting: Adult Health

## 2017-02-09 ENCOUNTER — Other Ambulatory Visit: Payer: BLUE CROSS/BLUE SHIELD

## 2017-02-09 DIAGNOSIS — C50412 Malignant neoplasm of upper-outer quadrant of left female breast: Secondary | ICD-10-CM

## 2017-02-09 DIAGNOSIS — Z170 Estrogen receptor positive status [ER+]: Secondary | ICD-10-CM

## 2017-02-09 DIAGNOSIS — E2839 Other primary ovarian failure: Secondary | ICD-10-CM

## 2017-02-09 HISTORY — DX: Malignant neoplasm of unspecified site of unspecified female breast: C50.919

## 2017-02-09 HISTORY — DX: Personal history of irradiation: Z92.3

## 2017-04-01 ENCOUNTER — Ambulatory Visit (HOSPITAL_BASED_OUTPATIENT_CLINIC_OR_DEPARTMENT_OTHER): Payer: BLUE CROSS/BLUE SHIELD | Admitting: Hematology and Oncology

## 2017-04-01 ENCOUNTER — Encounter: Payer: Self-pay | Admitting: Hematology and Oncology

## 2017-04-01 DIAGNOSIS — C50412 Malignant neoplasm of upper-outer quadrant of left female breast: Secondary | ICD-10-CM | POA: Diagnosis not present

## 2017-04-01 DIAGNOSIS — Z170 Estrogen receptor positive status [ER+]: Secondary | ICD-10-CM | POA: Diagnosis not present

## 2017-04-01 NOTE — Assessment & Plan Note (Signed)
07/23/2016: Left lumpectomy: IDC grade 1, 1 cm, with low-grade DCIS, margins negative, 0/1 lymph node negative, ER 100%, PR 100%, HER-2 negative ratio 1.58, Ki-67 10% T1 BN 0 stage IA Oncotype DX score 14: Risk of recurrence 9% Adjuvant radiation therapy 09/26/2016- 11/19/2016  Current treatment: Adjuvant antiestrogen therapy with letrozole 2.5 mg daily To start 12/2016  Letrozole toxicities:  Bilateral carpal tunnel syndrome: Sees Dr. Amedeo Plenty with [facility] Return to clinic in 1 year for follow-up

## 2017-04-01 NOTE — Progress Notes (Signed)
Patient Care Team: [name], MD as PCP - General (Family Medicine) [name], MD as Consulting Physician (General Surgery) [name], MD as Consulting Physician (Hematology and Oncology) [name], MD as Consulting Physician (Radiation Oncology) [name] [name], NP as Nurse Practitioner (Hematology and Oncology)  DIAGNOSIS:  Encounter Diagnosis  Name Primary?  . Malignant neoplasm of upper-outer quadrant of left breast in female, estrogen receptor positive ([place])     SUMMARY OF ONCOLOGIC HISTORY:   Malignant neoplasm of upper-outer quadrant of left female breast ([place])   07/01/2016 Initial Diagnosis    Left breast biopsy 2:00 position: IDC grade 1 with DCIS, ER 100%, PR 100%, Ki-67 10%, HER-2 negative ratio 1.58; mammogram revealed upper-outer quadrant nodule in the left breast 1 x 0.8 x 0.5 cm at 2:00 axilla negative, T1 BN 0 stage IA      07/23/2016 Surgery    Left lumpectomy [name]): IDC grade 1, 1 cm, with low-grade DCIS, margins negative, 0/1 lymph node negative, ER 100%, PR 100%, HER-2 negative ratio 1.58, Ki-67 10% T1 BN 0 stage IA       08/08/2016 Oncotype testing    Oncotype DX score 14: ROR 9%      08/22/2016 Genetic Testing    Patient has genetic testing done for personal and family history of breast cancer. BARD1 c.1409A>G and MUTYH c.1276C>G VUS found on the Breast/GYN panel.  The Breast/GYN gene panel offered by [facility] includes sequencing and rearrangement analysis for the following 23 genes:  ATM, BARD1, BRCA1, BRCA2, BRIP1, CDH1, CHEK2, EPCAM, FANCC, MLH1, MSH2, MSH6, MUTYH, NBN, NF1, PALB2, PMS2, POLD1, PTEN, RAD51C, RAD51D, RECQL, and TP53.         10/06/2016 - 11/19/2016 Radiation Therapy    Adjuvant radiation therapy ([place]): Left Breast treated to 50.4 Gy in 28 fractions, and then Boosted an additional 10 Gy in 5 fractions.      11/2016 -  Anti-estrogen oral therapy    Letrozole daily       Genetic Testing    Patient has  genetic testing done for a personal and family history of breat cancer. The Breast/GYN gene panel offered by [facility] includes sequencing and rearrangement analysis for the following 23 genes:  ATM, BARD1, BRCA1, BRCA2, BRIP1, CDH1, CHEK2, EPCAM, FANCC, MLH1, MSH2, MSH6, MUTYH, NBN, NF1, PALB2, PMS2, POLD1, PTEN, RAD51C, RAD51D, RECQL, and TP53.    Results revealed patient has the following mutation(s):  Negative for pathogenic variants in the 23 genes tested.  Two variants of uncertain significance identified. BARD1 c.1409A>G and MUTYH c.1276C>T VUS The report date is 08/22/2016.   Amendment: The date of this amended report is January 21, 2017.  Based on the ACMG standards and guidelines for the interpretation of sequence variants ([place] 2015) that utilizes a combination of sources, e.g., internal data, published literature, population databases and in silico models, the MUTYH, c.1276C>T (p.Arg426Cys) VUS has been reclassified to Likely Benign.  The classification of the  BARD1 c.1409A>G VUS has not been changed.          CHIEF COMPLIANT: Follow-up on letrozole therapy  INTERVAL HISTORY: Doris Bray is a 56 year old with above-mentioned history of left breast cancer currently on letrozole therapy and tolerating it moderately well. She had profound hot flashes for which she started taking gabapentin. And since then her hot flashes have lessened. She is not waking up at nighttime with the hot flashes as much. Her biggest complaint today is a bilateral carpal tunnel syndrome causing numbness and difficulty with  grip. She is very frustrated. She is hopeful to get surgery soon.  REVIEW OF SYSTEMS:   Constitutional: Denies fevers, chills or abnormal weight loss Eyes: Denies blurriness of vision Ears, nose, mouth, throat, and face: Denies mucositis or sore throat Respiratory: Denies cough, dyspnea or wheezes Cardiovascular: Denies palpitation, chest discomfort Gastrointestinal:  Denies  nausea, heartburn or change in bowel habits Skin: Denies abnormal skin rashes Lymphatics: Denies new lymphadenopathy or easy bruising Neurological:Denies numbness, tingling or new weaknesses Behavioral/Psych: Mood is stable, no new changes  Extremities: Bilateral carpal tunnel syndrome  All other systems were reviewed with the patient and are negative.  I have reviewed the past medical history, past surgical history, social history and family history with the patient and they are unchanged from previous note.  ALLERGIES:  is allergic to no known allergies.  MEDICATIONS:  Current Outpatient Prescriptions  Medication Sig Dispense Refill  . amphetamine-dextroamphetamine (ADDERALL XR) 20 MG 24 hr capsule Take 20 mg by mouth daily as needed (attention).    . gabapentin (NEURONTIN) 100 MG capsule Take 1 capsule (100 mg total) by mouth at bedtime. 90 capsule 0  . Ibuprofen-Diphenhydramine HCl (ADVIL PM) 200-25 MG CAPS Take by mouth.    . letrozole (FEMARA) 2.5 MG tablet Take 1 tablet (2.5 mg total) by mouth daily. 90 tablet 3  . metoprolol succinate (TOPROL XL) 25 MG 24 hr tablet Take 1 tablet (25 mg total) by mouth daily. 30 tablet 5   No current facility-administered medications for this visit.     PHYSICAL EXAMINATION: ECOG PERFORMANCE STATUS: 1 - Symptomatic but completely ambulatory  There were no vitals filed for this visit. There were no vitals filed for this visit.  GENERAL:alert, no distress and comfortable SKIN: skin color, texture, turgor are normal, no rashes or significant lesions EYES: normal, Conjunctiva are pink and non-injected, sclera clear OROPHARYNX:no exudate, no erythema and lips, buccal mucosa, and tongue normal  NECK: supple, thyroid normal size, non-tender, without nodularity LYMPH:  no palpable lymphadenopathy in the cervical, axillary or inguinal LUNGS: clear to auscultation and percussion with normal breathing effort HEART: regular rate & rhythm and no  murmurs and no lower extremity edema ABDOMEN:abdomen soft, non-tender and normal bowel sounds MUSCULOSKELETAL:no cyanosis of digits and no clubbing  NEURO: alert & oriented x 3 with fluent speech, no focal motor/sensory deficits EXTREMITIES: Bilateral carpal tunnel syndrome   LABORATORY DATA:  I have reviewed the data as listed   Chemistry      Component Value Date/Time   NA 141 11/24/2016 1259   K 4.2 11/24/2016 1259   CL 103 07/17/2016 1252   CO2 29 11/24/2016 1259   BUN 17.9 11/24/2016 1259   CREATININE 0.8 11/24/2016 1259      Component Value Date/Time   CALCIUM 10.1 11/24/2016 1259   ALKPHOS 51 11/24/2016 1259   AST 16 11/24/2016 1259   ALT 20 11/24/2016 1259   BILITOT 0.51 11/24/2016 1259       Lab Results  Component Value Date   WBC 5.3 11/24/2016   HGB 14.6 11/24/2016   HCT 42.3 11/24/2016   MCV 90.8 11/24/2016   PLT 212 11/24/2016   NEUTROABS 3.8 11/24/2016    ASSESSMENT & PLAN:  Malignant neoplasm of upper-outer quadrant of left female breast ([place]) 07/23/2016: Left lumpectomy: IDC grade 1, 1 cm, with low-grade DCIS, margins negative, 0/1 lymph node negative, ER 100%, PR 100%, HER-2 negative ratio 1.58, Ki-67 10% T1 BN 0 stage IA Oncotype DX score 14: Risk of  recurrence 9% Adjuvant radiation therapy 09/26/2016- 11/19/2016  Current treatment: Adjuvant antiestrogen therapy with letrozole 2.5 mg daily Started 12/2016  Letrozole toxicities: 1. Severe hot flashes: They have improved after she was prescribed gabapentin 100 mg at bedtime. She thinks that she is able to manage the hot flashes fairly well. They're not waking her up as much of my time.  Bilateral carpal tunnel syndrome: Sees Dr. Amedeo Plenty with [facility] orthopedics. She needs carpal tunnel surgery and is hoping to get that done so. She has no grip in her hands. Return to clinic in 6 months for follow-up   I spent 15 minutes talking to the patient of which more than half was spent in counseling and  coordination of care.  No orders of the defined types were placed in this encounter.  The patient has a good understanding of the overall plan. she agrees with it. she will call with any problems that may develop before the next visit here.   Rulon Eisenmenger, MD 04/01/17

## 2017-04-21 ENCOUNTER — Other Ambulatory Visit: Payer: Self-pay | Admitting: Adult Health

## 2017-04-21 DIAGNOSIS — R232 Flushing: Secondary | ICD-10-CM

## 2017-04-21 MED ORDER — GABAPENTIN 100 MG PO CAPS: 100.0000 mg | ORAL_CAPSULE | Freq: Every day | ORAL | 0 refills | Status: DC

## 2017-04-21 NOTE — Telephone Encounter (Signed)
Rx refill for Gabapentin sent to [facility] pharmacy per fax request.

## 2017-05-13 ENCOUNTER — Ambulatory Visit (INDEPENDENT_AMBULATORY_CARE_PROVIDER_SITE_OTHER): Payer: BLUE CROSS/BLUE SHIELD | Admitting: Cardiovascular Disease

## 2017-05-13 ENCOUNTER — Encounter: Payer: Self-pay | Admitting: Cardiovascular Disease

## 2017-05-13 VITALS — BP 150/88 | HR 78 | Ht 69.0 in | Wt 186.2 lb

## 2017-05-13 DIAGNOSIS — Z853 Personal history of malignant neoplasm of breast: Secondary | ICD-10-CM

## 2017-05-13 DIAGNOSIS — I10 Essential (primary) hypertension: Secondary | ICD-10-CM

## 2017-05-13 DIAGNOSIS — E781 Pure hyperglyceridemia: Secondary | ICD-10-CM

## 2017-05-13 DIAGNOSIS — Z8679 Personal history of other diseases of the circulatory system: Secondary | ICD-10-CM | POA: Diagnosis not present

## 2017-05-13 DIAGNOSIS — Z79899 Other long term (current) drug therapy: Secondary | ICD-10-CM | POA: Diagnosis not present

## 2017-05-13 NOTE — Progress Notes (Signed)
Cardiology Office Note    Date:  05/15/2017   ID:  Doris Bray, Doris Bray [birth year], MRN [id number]  PCP:  [name], MD  Cardiologist:  Shelva Majestic, MD   No chief complaint on file.   History of Present Illness:  Doris Bray is a 56 y.o. female who is a remote patient of Dr. Rollene Fare.  She has not seen him since 2007.  She presents to the office today for a 5 month follow-up cardiology evaluation  Doris Bray has 2 children who are currently 5 and 28.  During her first pregnancy, she had an episode of nonsustained ventricular tachycardia.  She was treated with beta blockers and went through her first pregnancy with normal delivery.  During her second pregnancy, she developed recurrent episodes of sustained monomorphic ventricular tachycardia.  Dr. Rollene Fare informally consulted Dr. Bethann Humble.  During her last trimester pregnancy.  She was in the hospital on intravenous Xylocaine and high-dose beta blocker therapy.  During that time, she was reading intense Mistry novels.  It was felt that she had a hyper adrenergic response or anxiety, which tended to precipitate her arrhythmia.  She had a normal delivery.  She was treated with beta blockers for several years thereafter and was without symptoms which led ultimately to slow taper and ultimate discontinuance.  She last saw Dr. Rollene Fare in October 2007 and at that time only noted intermittent palpitations which would last 30 seconds to several minutes and then resolved.  She also was diagnosed with hypertension.  Subsequently, she has continued to do well.  She ran a marathon approximately 2 years ago.  She later developed breast cancer involving the upper outer quadrant of the left breast which was estrogen receptor positive and is on Femara.  She had received radiation and lumpectomy.  She stopped running marathons after breast cancer.  Presently, she notes occasional palpitations most common when she is tired.  She also notes  some occasional sharp pains in her chest, which is different from indigestion symptoms.  She admits to occasional hot flashes.  She has some issues with carpal tunnel.  She had laboratory in February 2018 which showed hyperlipidemia with a total cholesterol 251, HDL 84, triglycerides 140, and LDL 139, with non-HDL 167.  She establish cardiology care with me in March 2018.  At that time, her blood pressure was elevated and with her history of palpitations.  I recommended resumption of low-dose beta blocker therapy with Toprol-XL.  She completed her radiation treatment in April.  She feels her palpitations have significantly improved with initiation of low-dose Toprol-XL at 25 mg.  She underwent an echo Doppler study on 01/20/2017 which revealed an EF of 55-60%.  There was mild LVH with grade 1 diastolic dysfunction.  There were no wall motion abnormalities.  Valvular architecture was normal.  She has had issues with carpal tunnel syndrome and may require surgery of her right hand to be done by Dr. Lisette Grinder.  She has not had recent laboratory.  She presents for reevaluation.  Past Medical History:  Diagnosis Date  . Bigeminy   . Breast cancer ([place]) 07/23/2016   malignant  . Family history of breast cancer   . Hematoma of breast 08/12/2016  . History of external beam radiation therapy 10/06/16-11/19/16   left breast 50.4 Gy in 28 fractions, boost 10 Gy in 5 fractions  . Malignant neoplasm of upper-outer quadrant of left female breast ([place]) 07/02/2016  . Personal history of radiation therapy  2018  . Ventricular tachycardia [facility])     Past Surgical History:  Procedure Laterality Date  . BREAST LUMPECTOMY Left 07/23/2016   malignant  . BREAST LUMPECTOMY WITH RADIOACTIVE SEED AND SENTINEL LYMPH NODE BIOPSY Left 07/23/2016   Procedure: LEFT BREAST LUMPECTOMY WITH RADIOACTIVE SEED AND SENTINEL LYMPH NODE BIOPSY, INJECT BLUE DYE LEFT BREAST;  Surgeon: [name], MD;  Location: [place];  Service: General;   Laterality: Left;  . CESAREAN SECTION     x2  . COLONOSCOPY    . EVACUATION BREAST HEMATOMA Left 08/12/2016   Procedure: EVACUATION HEMATOMA LEFT BREAST;  Surgeon: [name], MD;  Location: [place];  Service: General;  Laterality: Left;    Current Medications: Outpatient Medications Prior to Visit  Medication Sig Dispense Refill  . amphetamine-dextroamphetamine (ADDERALL XR) 20 MG 24 hr capsule Take 20 mg by mouth daily as needed (attention).    . gabapentin (NEURONTIN) 100 MG capsule Take 1 capsule (100 mg total) by mouth at bedtime. 90 capsule 0  . Ibuprofen-Diphenhydramine HCl (ADVIL PM) 200-25 MG CAPS Take by mouth.    . letrozole (FEMARA) 2.5 MG tablet Take 1 tablet (2.5 mg total) by mouth daily. 90 tablet 3  . metoprolol succinate (TOPROL XL) 25 MG 24 hr tablet Take 1 tablet (25 mg total) by mouth daily. 30 tablet 5   No facility-administered medications prior to visit.      Allergies:   No known allergies   Social History   Social History  . Marital status: Divorced    Spouse name: N/A  . Number of children: 2  . Years of education: N/A   Social History Main Topics  . Smoking status: Never Smoker  . Smokeless tobacco: Never Used  . Alcohol use 0.0 oz/week     Comment: 4 week  . Drug use: No  . Sexual activity: Not Asked   Other Topics Concern  . None   Social History Narrative  . None    Additional social history is notable in that she is a Designer, jewellery.  Patient worked for [employer], which is now [place].  She is divorced and is remarried for 10 years.  She exercises intermittently and runs.  She does drink occasional alcohol.  Wine.  There is no history of tobacco use.  Family History:  The patient's  family history includes Breast cancer (age of onset: 61) in her mother.  Her mother died at age 29 and had dementia.  Her father died at 67 and had a stroke.  She has one sister age 41.  She has 2 children who are now 41 and  30.  ROS General: Negative; No fevers, chills, or night sweats;  HEENT: Negative; No changes in vision or hearing, sinus congestion, difficulty swallowing Pulmonary: Negative; No cough, wheezing, shortness of breath, hemoptysis Cardiovascular: History of VT, see history of present illness GI: Negative; No nausea, vomiting, diarrhea, or abdominal pain GU: Negative; No dysuria, hematuria, or difficulty voiding Musculoskeletal:Positive for carpal tunnel and need for probable future right hand, thumb surgery Hematologic/Oncology: Positive for breast cancer Endocrine: Negative; no heat/cold intolerance; no diabetes Neuro: Negative; no changes in balance, headaches Skin: Negative; No rashes or skin lesions Psychiatric: Negative; No behavioral problems, depression Sleep: Negative; No snoring, daytime sleepiness, hypersomnolence, bruxism, restless legs, hypnogognic hallucinations, no cataplexy Other comprehensive 14 point system review is negative.   PHYSICAL EXAM:   VS:  BP (!) 150/88   Pulse 78   Ht '5\' 9"'$  (  1.753 m)   Wt 186 lb 3.2 oz (84.5 kg)   BMI 27.50 kg/m     Repeat blood pressure by me was 140/84  Wt Readings from Last 3 Encounters:  05/13/17 186 lb 3.2 oz (84.5 kg)  04/01/17 186 lb 6.4 oz (84.6 kg)  01/23/17 184 lb (83.5 kg)     General: Alert, oriented, no distress.  Skin: normal turgor, no rashes, warm and dry HEENT: Normocephalic, atraumatic. Pupils equal round and reactive to light; sclera anicteric; extraocular muscles intact;  Nose without nasal septal hypertrophy Mouth/Parynx benign; Mallinpatti scale 2 Neck: No JVD, no carotid bruits; normal carotid upstroke Lungs: clear to ausculatation and percussion; no wheezing or rales Chest wall: without tenderness to palpitation Heart: PMI not displaced, RRR, s1 s2 normal, 1/6 systolic murmur, no diastolic murmur, no rubs, gallops, thrills, or heaves Abdomen: soft, nontender; no hepatosplenomehaly, BS+; abdominal aorta  nontender and not dilated by palpation. Back: no CVA tenderness Pulses 2+ Musculoskeletal: full range of motion, normal strength, no joint deformities Extremities: no clubbing cyanosis or edema, Homan's sign negative  Neurologic: grossly nonfocal; Cranial nerves grossly wnl Psychologic: Normal mood and affect   Studies/Labs Reviewed:   EKG:  EKG is ordered today. ECG (independently read by me): Normal sinus rhythm at 78 bpm.  QS V1, V2.  Normal intervals.  May 2018 ECG (independently read by me): Normal sinus rhythm at 80 bpm.  No ST segment changes.  Normal intervals.  I personally and independently reviewed her last ECG from Dr.Weintraub in 05/19/2006.  This shows normal sinus rhythm at 90 bpm.  PR interval 142 ms, QRS duration 78 ms.  Recent Labs: BMP Latest Ref Rng & Units 05/14/2017 11/24/2016 07/17/2016  Glucose 65 - 99 mg/dL 78 115 134(H)  BUN 6 - 24 mg/dL 13 17.9 14  Creatinine 0.57 - 1.00 mg/dL 0.60 0.8 0.76  BUN/Creat Ratio 9 - 23 22 - -  Sodium 134 - 144 mmol/L 140 141 138  Potassium 3.5 - 5.2 mmol/L 4.2 4.2 3.8  Chloride 96 - 106 mmol/L 101 - 103  CO2 20 - 29 mmol/L '21 29 27  '$ Calcium 8.7 - 10.2 mg/dL 9.6 10.1 9.4     Hepatic Function Latest Ref Rng & Units 05/14/2017 11/24/2016 07/09/2016  Total Protein 6.0 - 8.5 g/dL 7.2 7.4 7.1  Albumin 3.5 - 5.5 g/dL 4.7 4.1 3.8  AST 0 - 40 IU/L '18 16 17  '$ ALT 0 - 32 IU/L '19 20 19  '$ Alk Phosphatase 39 - 117 IU/L 55 51 46  Total Bilirubin 0.0 - 1.2 mg/dL 0.3 0.51 0.42    CBC Latest Ref Rng & Units 05/14/2017 11/24/2016 08/13/2016  WBC 3.4 - 10.8 x10E3/uL 3.6 5.3 4.2  Hemoglobin 11.1 - 15.9 g/dL 13.7 14.6 11.4(L)  Hematocrit 34.0 - 46.6 % 40.2 42.3 34.2(L)  Platelets 150 - 379 x10E3/uL 226 212 196   Lab Results  Component Value Date   MCV 93 05/14/2017   MCV 90.8 11/24/2016   MCV 95.5 08/13/2016   Lab Results  Component Value Date   TSH 1.780 05/14/2017   No results found for: HGBA1C   BNP No results found for:  BNP  ProBNP No results found for: PROBNP   Lipid Panel     Component Value Date/Time   CHOL 233 (H) 05/14/2017 0932   TRIG 130 05/14/2017 0932   HDL 80 05/14/2017 0932   CHOLHDL 2.9 05/14/2017 0932   LDLCALC 127 (H) 05/14/2017 0932     RADIOLOGY:  No results found.   Additional studies/ records that were reviewed today include:  I reviewed Dr. Lowella Fairy records.  She had a nuclear perfusion study in November 2017 which showed normal perfusion.  Her last Holter monitor study in 2007 showed sinus rhythm with episodes of sinus tachycardia and PVCs and she had a short atrial run.    ASSESSMENT:    1. Essential hypertension   2. Pure hyperglyceridemia   3. H/O ventricular tachycardia   4. Medication management   5. History of breast cancer      PLAN:  Doris Bray is a 56 year old female who has a remote history of nonsustained VT during her first pregnancy and sustained monomorphic VT during her second pregnancy.  At the time, it was felt that she had a hyperadrenergic response, contributing to her arrhythmia in the setting of her pregnancy state.  She did not have any preeclampsia, but had mild hypertension.  An echo Doppler study at that time reportedly did not show any structural heart disease.  She had had intermittent palpitations.  Remotely, she was started on amlodipine, valsartan, hypertension, and also was on simvastatin for hyperlipidemia. When I initially saw her for establishment of care with me she was not on any cardiac medications.  She was experiencing intermittent palpitations.  With initiation of low-dose Toprol-XL 25 mg, her palpitations have dramatically improved.  She is now asymptomatic.  I have recommended that if her blood pressure consistently is above 140 to increase her Toprol-XL to 37.5 or 50 mg daily.  She is in need for possible right hand surgery.  She will return tomorrow in the fasting state.  For complete blood work.  I reviewed her echo  Doppler dated with her in detail.  She has mild LVH with grade 1 diastolic dysfunction.  I will contact her regarding her laboratory and adjustments to her medications will be made if necessary.  She completed her radiation treatment in April 2018 for her breast CA and continues to take Femara.  As long as she remains stable, I'll see her in 6 months for cardiology reevaluation.  Medication Adjustments/Labs and Tests Ordered: Current medicines are reviewed at length with the patient today.  Concerns regarding medicines are outlined above.  Medication changes, Labs and Tests ordered today are listed in the Patient Instructions below. Patient Instructions  Medication Instructions:  Your physician recommends that you continue on your current medications as directed. Please refer to the Current Medication list given to you today.  If you continue to have palpitations or BP increased >737 systolic, ok to increase metoprolol (Toprol XL) to 37.'5mg'$  or '50mg'$  daily  Labwork: Please return for FASTING labs tomorrow (CMET, CBC, Lipid, TSH, Mag)  Our in office lab hours are Monday-Friday 8:00-4:30, closed for lunch 1-2 pm.  No appointment needed.  Follow-Up: Your physician wants you to follow-up in: 6 MONTHS with Dr. Claiborne Billings. You will receive a reminder letter in the mail two months in advance. If you don't receive a letter, please call our office to schedule the follow-up appointment.   Any Other Special Instructions Will Be Listed Below (If Applicable).     If you need a refill on your cardiac medications before your next appointment, please call your pharmacy.      Signed, Shelva Majestic, MD  05/15/2017 4:36 PM    [facility] [address] [place], [place], [place]  [address] Phone: [phone]

## 2017-05-13 NOTE — Patient Instructions (Addendum)
Medication Instructions:  Your physician recommends that you continue on your current medications as directed. Please refer to the Current Medication list given to you today.  If you continue to have palpitations or BP increased >768 systolic, ok to increase metoprolol (Toprol XL) to 37.5mg  or 50mg  daily  Labwork: Please return for FASTING labs tomorrow (CMET, CBC, Lipid, TSH, Mag)  Our in office lab hours are Monday-Friday 8:00-4:30, closed for lunch 1-2 pm.  No appointment needed.  Follow-Up: Your physician wants you to follow-up in: 6 MONTHS with Dr. Claiborne Billings. You will receive a reminder letter in the mail two months in advance. If you don't receive a letter, please call our office to schedule the follow-up appointment.   Any Other Special Instructions Will Be Listed Below (If Applicable).     If you need a refill on your cardiac medications before your next appointment, please call your pharmacy.

## 2017-05-14 ENCOUNTER — Other Ambulatory Visit: Payer: Self-pay | Admitting: Cardiovascular Disease

## 2017-05-14 LAB — COMPREHENSIVE METABOLIC PANEL
ALT: 19 IU/L (ref 0–32)
AST: 18 IU/L (ref 0–40)
Albumin/Globulin Ratio: 1.9 (ref 1.2–2.2)
Albumin: 4.7 g/dL (ref 3.5–5.5)
Alkaline Phosphatase: 55 IU/L (ref 39–117)
BUN / CREAT RATIO: 22 (ref 9–23)
BUN: 13 mg/dL (ref 6–24)
Bilirubin Total: 0.3 mg/dL (ref 0.0–1.2)
CALCIUM: 9.6 mg/dL (ref 8.7–10.2)
CO2: 21 mmol/L (ref 20–29)
CREATININE: 0.6 mg/dL (ref 0.57–1.00)
Chloride: 101 mmol/L (ref 96–106)
GFR calc Af Amer: 116 mL/min/{1.73_m2} (ref >59)
GFR, EST NON AFRICAN AMERICAN: 101 mL/min/{1.73_m2} (ref >59)
GLUCOSE: 78 mg/dL (ref 65–99)
Globulin, Total: 2.5 g/dL (ref 1.5–4.5)
Potassium: 4.2 mmol/L (ref 3.5–5.2)
SODIUM: 140 mmol/L (ref 134–144)
TOTAL PROTEIN: 7.2 g/dL (ref 6.0–8.5)

## 2017-05-14 LAB — LIPID PANEL
Chol/HDL Ratio: 2.9 ratio (ref 0.0–4.4)
Cholesterol, Total: 233 mg/dL — ABNORMAL HIGH (ref 100–199)
HDL: 80 mg/dL (ref >39)
LDL Calculated: 127 mg/dL — ABNORMAL HIGH (ref 0–99)
TRIGLYCERIDES: 130 mg/dL (ref 0–149)
VLDL CHOLESTEROL CAL: 26 mg/dL (ref 5–40)

## 2017-05-14 LAB — CBC
Hematocrit: 40.2 % (ref 34.0–46.6)
Hemoglobin: 13.7 g/dL (ref 11.1–15.9)
MCH: 31.8 pg (ref 26.6–33.0)
MCHC: 34.1 g/dL (ref 31.5–35.7)
MCV: 93 fL (ref 79–97)
PLATELETS: 226 10*3/uL (ref 150–379)
RBC: 4.31 x10E6/uL (ref 3.77–5.28)
RDW: 12.9 % (ref 12.3–15.4)
WBC: 3.6 10*3/uL (ref 3.4–10.8)

## 2017-05-14 LAB — TSH: TSH: 1.78 u[IU]/mL (ref 0.450–4.500)

## 2017-05-14 LAB — MAGNESIUM: Magnesium: 1.9 mg/dL (ref 1.6–2.3)

## 2017-06-23 ENCOUNTER — Telehealth: Payer: Self-pay | Admitting: *Deleted

## 2017-06-23 DIAGNOSIS — E7800 Pure hypercholesterolemia, unspecified: Secondary | ICD-10-CM

## 2017-06-23 DIAGNOSIS — Z5181 Encounter for therapeutic drug level monitoring: Secondary | ICD-10-CM

## 2017-06-23 NOTE — Telephone Encounter (Signed)
Patient informed and verbalized understanding of plan. Lab orders placed for LFT's & FLP in 4-6 months.

## 2017-06-23 NOTE — Telephone Encounter (Signed)
-----   Message from Troy Sine, MD sent at 06/22/2017  9:50 AM EST ----- Labs good, although total cholesterol elevated at 233.  Excellent HDL at 80.  LDL 127.  Would make dietary adjustment.  Recheck lipid studies in 4-6 months.  If LDL continues to be elevated, will discuss at next office visit with patient the possibility of initiating therapy with either Zetia or statin.

## 2017-07-10 ENCOUNTER — Other Ambulatory Visit: Payer: Self-pay

## 2017-07-10 DIAGNOSIS — R232 Flushing: Secondary | ICD-10-CM

## 2017-07-10 MED ORDER — GABAPENTIN 100 MG PO CAPS: 100.0000 mg | ORAL_CAPSULE | Freq: Three times a day (TID) | ORAL | 3 refills | Status: DC

## 2017-07-10 NOTE — Telephone Encounter (Signed)
Pt calling to request more refills on her gabapentin. She would like to increase her gabapentin dose since her hot flashes are getting worse. She was told by Dr.Gudena to call if she needed her dose increased. Increased dose to 100mg  TID. Will send to pharmacy for refill now and notified Dr.Gudena of change. Pt very thankful and will call if she has any more issues.

## 2017-09-30 ENCOUNTER — Inpatient Hospital Stay: Payer: BLUE CROSS/BLUE SHIELD | Attending: Hematology and Oncology | Admitting: Hematology and Oncology

## 2017-09-30 DIAGNOSIS — Z803 Family history of malignant neoplasm of breast: Secondary | ICD-10-CM | POA: Diagnosis not present

## 2017-09-30 DIAGNOSIS — Z79811 Long term (current) use of aromatase inhibitors: Secondary | ICD-10-CM | POA: Insufficient documentation

## 2017-09-30 DIAGNOSIS — Z923 Personal history of irradiation: Secondary | ICD-10-CM | POA: Insufficient documentation

## 2017-09-30 DIAGNOSIS — N951 Menopausal and female climacteric states: Secondary | ICD-10-CM | POA: Insufficient documentation

## 2017-09-30 DIAGNOSIS — R232 Flushing: Secondary | ICD-10-CM

## 2017-09-30 DIAGNOSIS — C50412 Malignant neoplasm of upper-outer quadrant of left female breast: Secondary | ICD-10-CM | POA: Diagnosis not present

## 2017-09-30 DIAGNOSIS — Z170 Estrogen receptor positive status [ER+]: Secondary | ICD-10-CM | POA: Diagnosis not present

## 2017-09-30 MED ORDER — GABAPENTIN 100 MG PO CAPS: 200.0000 mg | ORAL_CAPSULE | Freq: Every day | ORAL | 3 refills | Status: DC

## 2017-09-30 MED ORDER — LETROZOLE 2.5 MG PO TABS: 2.5000 mg | ORAL_TABLET | Freq: Every day | ORAL | 3 refills | Status: AC

## 2017-09-30 NOTE — Progress Notes (Signed)
Patient Care Team: [name], MD as PCP - General (Family Medicine) [name], MD as Consulting Physician (General Surgery) [name], MD as Consulting Physician (Hematology and Oncology) [name], MD as Consulting Physician (Radiation Oncology) [name] [name], NP as Nurse Practitioner (Hematology and Oncology)  DIAGNOSIS:  Encounter Diagnoses  Name Primary?  . Malignant neoplasm of upper-outer quadrant of left breast in female, estrogen receptor positive ([name])   . Hot flashes     SUMMARY OF ONCOLOGIC HISTORY:   Malignant neoplasm of upper-outer quadrant of left female breast ([name])   07/01/2016 Initial Diagnosis    Left breast biopsy 2:00 position: IDC grade 1 with DCIS, ER 100%, PR 100%, Ki-67 10%, HER-2 negative ratio 1.58; mammogram revealed upper-outer quadrant nodule in the left breast 1 x 0.8 x 0.5 cm at 2:00 axilla negative, T1 BN 0 stage IA      07/23/2016 Surgery    Left lumpectomy [name]): IDC grade 1, 1 cm, with low-grade DCIS, margins negative, 0/1 lymph node negative, ER 100%, PR 100%, HER-2 negative ratio 1.58, Ki-67 10% T1 BN 0 stage IA       08/08/2016 Oncotype testing    Oncotype DX score 14: ROR 9%      08/22/2016 Genetic Testing    Patient has genetic testing done for personal and family history of breast cancer. BARD1 c.1409A>G and MUTYH c.1276C>G VUS found on the Breast/GYN panel.  The Breast/GYN gene panel offered by [facility] includes sequencing and rearrangement analysis for the following 23 genes:  ATM, BARD1, BRCA1, BRCA2, BRIP1, CDH1, CHEK2, EPCAM, FANCC, MLH1, MSH2, MSH6, MUTYH, NBN, NF1, PALB2, PMS2, POLD1, PTEN, RAD51C, RAD51D, RECQL, and TP53.         10/06/2016 - 11/19/2016 Radiation Therapy    Adjuvant radiation therapy ([place]): Left Breast treated to 50.4 Gy in 28 fractions, and then Boosted an additional 10 Gy in 5 fractions.      11/2016 -  Anti-estrogen oral therapy    Letrozole daily       Genetic Testing   Patient has genetic testing done for a personal and family history of breat cancer. The Breast/GYN gene panel offered by [facility] includes sequencing and rearrangement analysis for the following 23 genes:  ATM, BARD1, BRCA1, BRCA2, BRIP1, CDH1, CHEK2, EPCAM, FANCC, MLH1, MSH2, MSH6, MUTYH, NBN, NF1, PALB2, PMS2, POLD1, PTEN, RAD51C, RAD51D, RECQL, and TP53.    Results revealed patient has the following mutation(s):  Negative for pathogenic variants in the 23 genes tested.  Two variants of uncertain significance identified. BARD1 c.1409A>G and MUTYH c.1276C>T VUS The report date is 08/22/2016.   Amendment: The date of this amended report is January 21, 2017.  Based on the ACMG standards and guidelines for the interpretation of sequence variants ([place] 2015) that utilizes a combination of sources, e.g., internal data, published literature, population databases and in silico models, the MUTYH, c.1276C>T (p.Arg426Cys) VUS has been reclassified to Likely Benign.  The classification of the  BARD1 c.1409A>G VUS has not been changed.          CHIEF COMPLIANT: Follow-up on letrozole therapy  INTERVAL HISTORY: Doris Bray is a 57 year old with above-mentioned history of left breast cancer currently on adjuvant antiestrogen therapy with letrozole.  She tolerates letrozole fairly well.  She does have hot flashes for which she takes gabapentin.  She also had diffuse arthralgias myalgias but they are bearable.  She denies any lumps or nodules in the breast.  REVIEW OF SYSTEMS:   Constitutional: Denies  fevers, chills or abnormal weight loss Eyes: Denies blurriness of vision Ears, nose, mouth, throat, and face: Denies mucositis or sore throat Respiratory: Denies cough, dyspnea or wheezes Cardiovascular: Denies palpitation, chest discomfort Gastrointestinal:  Denies nausea, heartburn or change in bowel habits Skin: Denies abnormal skin rashes Lymphatics: Denies new lymphadenopathy or easy  bruising Neurological:Denies numbness, tingling or new weaknesses Behavioral/Psych: Mood is stable, no new changes  Extremities: No lower extremity edema Breast:  denies any pain or lumps or nodules in either breasts All other systems were reviewed with the patient and are negative.  I have reviewed the past medical history, past surgical history, social history and family history with the patient and they are unchanged from previous note.  ALLERGIES:  is allergic to no known allergies.  MEDICATIONS:  Current Outpatient Medications  Medication Sig Dispense Refill  . amphetamine-dextroamphetamine (ADDERALL XR) 20 MG 24 hr capsule Take 20 mg by mouth daily as needed (attention).    . gabapentin (NEURONTIN) 100 MG capsule Take 2 capsules (200 mg total) by mouth at bedtime. 180 capsule 3  . Ibuprofen-Diphenhydramine HCl (ADVIL PM) 200-25 MG CAPS Take by mouth.    . letrozole (FEMARA) 2.5 MG tablet Take 1 tablet (2.5 mg total) by mouth daily. 90 tablet 3  . metoprolol succinate (TOPROL-XL) 25 MG 24 hr tablet Take 1 tablet (25 mg total) by mouth daily. 30 tablet 5   No current facility-administered medications for this visit.     PHYSICAL EXAMINATION: ECOG PERFORMANCE STATUS: 1 - Symptomatic but completely ambulatory  Vitals:   09/30/17 1120  BP: (!) 149/91  Pulse: 78  Resp: 20  Temp: 97.7 F (36.5 C)  SpO2: 100%   Filed Weights   09/30/17 1120  Weight: 190 lb 4.8 oz (86.3 kg)    GENERAL:alert, no distress and comfortable SKIN: skin color, texture, turgor are normal, no rashes or significant lesions EYES: normal, Conjunctiva are pink and non-injected, sclera clear OROPHARYNX:no exudate, no erythema and lips, buccal mucosa, and tongue normal  NECK: supple, thyroid normal size, non-tender, without nodularity LYMPH:  no palpable lymphadenopathy in the cervical, axillary or inguinal LUNGS: clear to auscultation and percussion with normal breathing effort HEART: regular rate &  rhythm and no murmurs and no lower extremity edema ABDOMEN:abdomen soft, non-tender and normal bowel sounds MUSCULOSKELETAL:no cyanosis of digits and no clubbing  NEURO: alert & oriented x 3 with fluent speech, no focal motor/sensory deficits EXTREMITIES: No lower extremity edema BREAST: No palpable masses or nodules in either right or left breasts. No palpable axillary supraclavicular or infraclavicular adenopathy no breast tenderness or nipple discharge. (exam performed in the presence of a chaperone)  LABORATORY DATA:  I have reviewed the data as listed CMP Latest Ref Rng & Units 05/14/2017 11/24/2016 07/17/2016  Glucose 65 - 99 mg/dL 78 115 134(H)  BUN 6 - 24 mg/dL 13 17.9 14  Creatinine 0.57 - 1.00 mg/dL 0.60 0.8 0.76  Sodium 134 - 144 mmol/L 140 141 138  Potassium 3.5 - 5.2 mmol/L 4.2 4.2 3.8  Chloride 96 - 106 mmol/L 101 - 103  CO2 20 - 29 mmol/L _0 Calcium 8.7 - 10.2 mg/dL 9.6 10.1 9.4  Total Protein 6.0 - 8.5 g/dL 7.2 7.4 -  Total Bilirubin 0.0 - 1.2 mg/dL 0.3 0.51 -  Alkaline Phos 39 - 117 IU/L 55 51 -  AST 0 - 40 IU/L 18 16 -  ALT 0 - 32 IU/L 19 20 -    Lab Results  Component Value Date   WBC 3.6 05/14/2017   HGB 13.7 05/14/2017   HCT 40.2 05/14/2017   MCV 93 05/14/2017   PLT 226 05/14/2017   NEUTROABS 3.8 11/24/2016    ASSESSMENT & PLAN:  Malignant neoplasm of upper-outer quadrant of left female breast ([place]) 07/23/2016: Left lumpectomy: IDC grade 1, 1 cm, with low-grade DCIS, margins negative, 0/1 lymph node negative, ER 100%, PR 100%, HER-2 negative ratio 1.58, Ki-67 10% T1 BN 0 stage IA Oncotype DX score 14: Risk of recurrence 9% Adjuvant radiation therapy 09/26/2016- 11/19/2016  Current treatment: Adjuvant antiestrogen therapy with letrozole 2.5 mg daily Started 12/2016  Letrozole toxicities: 1. Severe hot flashes: They have improved after she was prescribed gabapentin 200 mg at bedtime. 2. arthralgias and myalgias bearable   Bilateral carpal  tunnel syndrome: Sees Dr. Vernon Prey orthopedics.  Patient is going to [place] and will not be coming back to see [place].  Once she finds a physician we will be happy to send her records over.  I sent a new prescription for letrozole and gabapentin that would last for an entire year.  I spent 25 minutes talking to the patient of which more than half was spent in counseling and coordination of care.  No orders of the defined types were placed in this encounter.  The patient has a good understanding of the overall plan. she agrees with it. she will call with any problems that may develop before the next visit here.   Harriette Ohara, MD 09/30/17

## 2017-09-30 NOTE — Assessment & Plan Note (Signed)
07/23/2016: Left lumpectomy: IDC grade 1, 1 cm, with low-grade DCIS, margins negative, 0/1 lymph node negative, ER 100%, PR 100%, HER-2 negative ratio 1.58, Ki-67 10% T1 BN 0 stage IA Oncotype DX score 14: Risk of recurrence 9% Adjuvant radiation therapy 09/26/2016- 11/19/2016  Current treatment: Adjuvant antiestrogen therapy with letrozole 2.5 mg daily Started 12/2016  Letrozole toxicities: 1. Severe hot flashes: They have improved after she was prescribed gabapentin 100 mg at bedtime.   Bilateral carpal tunnel syndrome: Sees Dr. Vernon Prey orthopedics.  Return to clinic in 1 year for follow-up

## 2017-11-04 ENCOUNTER — Other Ambulatory Visit: Payer: Self-pay | Admitting: Hematology and Oncology

## 2017-11-18 ENCOUNTER — Other Ambulatory Visit: Payer: Self-pay | Admitting: Cardiovascular Disease

## 2017-11-18 NOTE — Telephone Encounter (Signed)
REFILL 

## 2018-04-08 ENCOUNTER — Telehealth: Payer: Self-pay | Admitting: *Deleted

## 2018-04-08 NOTE — Telephone Encounter (Signed)
ROI mailed to patient new address; release [phone]

## 2018-06-07 ENCOUNTER — Other Ambulatory Visit: Payer: Self-pay

## 2018-06-07 MED ORDER — METOPROLOL SUCCINATE ER 25 MG PO TB24: 25.0000 mg | ORAL_TABLET | Freq: Every day | ORAL | 0 refills | Status: AC

## 2018-06-07 NOTE — Telephone Encounter (Signed)
Called patient to verify pharmacy due to incoming fax [place] pharmacy [place]. Patient stated that she did move out of [place] in April [place]. Patient was also informed that she will get a 30 day supply no refills as a courtesy and to try her best to get a new Cardiologist in her new state to help keep track of her healthcare.

## 2018-10-07 ENCOUNTER — Other Ambulatory Visit: Payer: Self-pay | Admitting: Hematology and Oncology

## 2018-10-07 DIAGNOSIS — R232 Flushing: Secondary | ICD-10-CM

## 2019-07-28 ENCOUNTER — Other Ambulatory Visit: Payer: Self-pay | Admitting: Hematology and Oncology

## 2019-07-28 DIAGNOSIS — R232 Flushing: Secondary | ICD-10-CM
